# Patient Record
Sex: Male | Born: 1970 | Race: White | Hispanic: No | Marital: Married | Smoking: Never smoker
Health system: Southern US, Community
[De-identification: ages and names within clinical notes are randomized; demographics above are authoritative.]

## PROBLEM LIST (undated history)

## (undated) DIAGNOSIS — Z87442 Personal history of urinary calculi: Secondary | ICD-10-CM

## (undated) DIAGNOSIS — I10 Essential (primary) hypertension: Secondary | ICD-10-CM

## (undated) HISTORY — PX: HERNIA REPAIR: SHX51

## (undated) HISTORY — PX: APPENDECTOMY: SHX54

## (undated) HISTORY — PX: FOOT TENDON SURGERY: SHX958

## (undated) HISTORY — PX: FRACTURE SURGERY: SHX138

## (undated) SURGERY — Surgical Case
Anesthesia: *Unknown

---

## 2000-10-06 ENCOUNTER — Encounter: Payer: Self-pay | Admitting: *Deleted

## 2000-10-06 ENCOUNTER — Emergency Department (HOSPITAL_COMMUNITY): Admission: EM | Admit: 2000-10-06 | Discharge: 2000-10-06 | Payer: Self-pay | Admitting: *Deleted

## 2003-11-07 ENCOUNTER — Ambulatory Visit (HOSPITAL_COMMUNITY): Admission: RE | Admit: 2003-11-07 | Discharge: 2003-11-07 | Payer: Self-pay | Admitting: Family Medicine

## 2003-12-12 ENCOUNTER — Ambulatory Visit (HOSPITAL_COMMUNITY): Admission: RE | Admit: 2003-12-12 | Discharge: 2003-12-12 | Payer: Self-pay | Admitting: Urology

## 2006-12-28 DIAGNOSIS — I10 Essential (primary) hypertension: Secondary | ICD-10-CM

## 2006-12-28 HISTORY — DX: Essential (primary) hypertension: I10

## 2016-09-30 ENCOUNTER — Other Ambulatory Visit (HOSPITAL_COMMUNITY): Payer: Self-pay | Admitting: Internal Medicine

## 2016-09-30 DIAGNOSIS — R599 Enlarged lymph nodes, unspecified: Secondary | ICD-10-CM

## 2016-10-03 ENCOUNTER — Ambulatory Visit (HOSPITAL_COMMUNITY)
Admission: RE | Admit: 2016-10-03 | Discharge: 2016-10-03 | Disposition: A | Discharge status: Home / Self Care | Payer: 59 | Source: Ambulatory Visit | Attending: Internal Medicine | Admitting: Internal Medicine

## 2016-10-03 DIAGNOSIS — R599 Enlarged lymph nodes, unspecified: Secondary | ICD-10-CM | POA: Diagnosis not present

## 2018-01-23 ENCOUNTER — Observation Stay (HOSPITAL_COMMUNITY)
Admission: EM | Admit: 2018-01-23 | Discharge: 2018-01-25 | Disposition: A | Discharge status: Home / Self Care | Payer: Managed Care, Other (non HMO) | Attending: Surgery | Admitting: Surgery

## 2018-01-23 ENCOUNTER — Emergency Department (HOSPITAL_COMMUNITY): Payer: Managed Care, Other (non HMO)

## 2018-01-23 ENCOUNTER — Encounter (HOSPITAL_COMMUNITY): Payer: Self-pay | Admitting: Emergency Medicine

## 2018-01-23 ENCOUNTER — Other Ambulatory Visit: Payer: Self-pay

## 2018-01-23 DIAGNOSIS — Z419 Encounter for procedure for purposes other than remedying health state, unspecified: Secondary | ICD-10-CM

## 2018-01-23 DIAGNOSIS — Z8719 Personal history of other diseases of the digestive system: Secondary | ICD-10-CM | POA: Insufficient documentation

## 2018-01-23 DIAGNOSIS — Z7982 Long term (current) use of aspirin: Secondary | ICD-10-CM | POA: Diagnosis not present

## 2018-01-23 DIAGNOSIS — K8021 Calculus of gallbladder without cholecystitis with obstruction: Secondary | ICD-10-CM

## 2018-01-23 DIAGNOSIS — R0789 Other chest pain: Secondary | ICD-10-CM

## 2018-01-23 DIAGNOSIS — I10 Essential (primary) hypertension: Secondary | ICD-10-CM | POA: Insufficient documentation

## 2018-01-23 DIAGNOSIS — Z79899 Other long term (current) drug therapy: Secondary | ICD-10-CM | POA: Diagnosis not present

## 2018-01-23 DIAGNOSIS — K8000 Calculus of gallbladder with acute cholecystitis without obstruction: Secondary | ICD-10-CM | POA: Diagnosis present

## 2018-01-23 DIAGNOSIS — K8011 Calculus of gallbladder with chronic cholecystitis with obstruction: Secondary | ICD-10-CM | POA: Diagnosis not present

## 2018-01-23 HISTORY — DX: Essential (primary) hypertension: I10

## 2018-01-23 LAB — HEPATIC FUNCTION PANEL
ALK PHOS: 49 U/L (ref 38–126)
ALT: 23 U/L (ref 0–44)
AST: 57 U/L — AB (ref 15–41)
Albumin: 3.9 g/dL (ref 3.5–5.0)
BILIRUBIN DIRECT: 0.8 mg/dL — AB (ref 0.0–0.2)
BILIRUBIN TOTAL: 2.1 mg/dL — AB (ref 0.3–1.2)
Indirect Bilirubin: 1.3 mg/dL — ABNORMAL HIGH (ref 0.3–0.9)
Total Protein: 6.5 g/dL (ref 6.5–8.1)

## 2018-01-23 LAB — BASIC METABOLIC PANEL
Anion gap: 9 (ref 5–15)
BUN: 14 mg/dL (ref 6–20)
CALCIUM: 9.1 mg/dL (ref 8.9–10.3)
CO2: 27 mmol/L (ref 22–32)
CREATININE: 1.14 mg/dL (ref 0.61–1.24)
Chloride: 103 mmol/L (ref 98–111)
GFR calc non Af Amer: >60 mL/min (ref >60)
Glucose, Bld: 111 mg/dL — ABNORMAL HIGH (ref 70–99)
Potassium: 4.1 mmol/L (ref 3.5–5.1)
SODIUM: 139 mmol/L (ref 135–145)

## 2018-01-23 LAB — CBC
HCT: 49.4 % (ref 39.0–52.0)
Hemoglobin: 16.7 g/dL (ref 13.0–17.0)
MCH: 29.7 pg (ref 26.0–34.0)
MCHC: 33.8 g/dL (ref 30.0–36.0)
MCV: 87.7 fL (ref 78.0–100.0)
Platelets: 229 10*3/uL (ref 150–400)
RBC: 5.63 MIL/uL (ref 4.22–5.81)
RDW: 13.6 % (ref 11.5–15.5)
WBC: 7.7 10*3/uL (ref 4.0–10.5)

## 2018-01-23 LAB — I-STAT TROPONIN, ED
TROPONIN I, POC: 0.01 ng/mL (ref 0.00–0.08)
Troponin i, poc: 0 ng/mL (ref 0.00–0.08)

## 2018-01-23 LAB — LIPASE, BLOOD: Lipase: 33 U/L (ref 11–51)

## 2018-01-23 LAB — SURGICAL PCR SCREEN
MRSA, PCR: NEGATIVE
STAPHYLOCOCCUS AUREUS: NEGATIVE

## 2018-01-23 MED ORDER — LOSARTAN POTASSIUM-HCTZ 100-12.5 MG PO TABS: 1.0000 | ORAL_TABLET | Freq: Every day | ORAL | Status: DC

## 2018-01-23 MED ORDER — DIPHENHYDRAMINE HCL 50 MG/ML IJ SOLN: 25.0000 mg | Freq: Four times a day (QID) | INTRAMUSCULAR | Status: DC | PRN

## 2018-01-23 MED ORDER — MORPHINE SULFATE (PF) 2 MG/ML IV SOLN
2.0000 mg | INTRAVENOUS | Status: DC | PRN
  Administered 2018-01-23 – 2018-01-24 (×2): 2 mg via INTRAVENOUS
  Filled 2018-01-23 (×2): qty 1

## 2018-01-23 MED ORDER — ONDANSETRON HCL 4 MG/2ML IJ SOLN
4.0000 mg | Freq: Four times a day (QID) | INTRAMUSCULAR | Status: DC | PRN
  Administered 2018-01-23 – 2018-01-24 (×2): 4 mg via INTRAVENOUS
  Filled 2018-01-23 (×2): qty 2

## 2018-01-23 MED ORDER — ONDANSETRON 4 MG PO TBDP: 4.0000 mg | ORAL_TABLET | Freq: Four times a day (QID) | ORAL | Status: DC | PRN

## 2018-01-23 MED ORDER — HYDROCHLOROTHIAZIDE 12.5 MG PO CAPS
12.5000 mg | ORAL_CAPSULE | Freq: Every day | ORAL | Status: DC
  Administered 2018-01-23 – 2018-01-25 (×2): 12.5 mg via ORAL
  Filled 2018-01-23 (×2): qty 1

## 2018-01-23 MED ORDER — AMLODIPINE BESYLATE 5 MG PO TABS
5.0000 mg | ORAL_TABLET | Freq: Every day | ORAL | Status: DC
  Administered 2018-01-23 – 2018-01-25 (×2): 5 mg via ORAL
  Filled 2018-01-23 (×2): qty 1

## 2018-01-23 MED ORDER — ACETAMINOPHEN 325 MG PO TABS
650.0000 mg | ORAL_TABLET | Freq: Four times a day (QID) | ORAL | Status: DC | PRN
  Administered 2018-01-23 – 2018-01-25 (×3): 650 mg via ORAL
  Filled 2018-01-23 (×3): qty 2

## 2018-01-23 MED ORDER — SODIUM CHLORIDE 0.9 % IV SOLN
2.0000 g | INTRAVENOUS | Status: DC
  Administered 2018-01-23 – 2018-01-25 (×3): 2 g via INTRAVENOUS
  Filled 2018-01-23 (×3): qty 20

## 2018-01-23 MED ORDER — ACETAMINOPHEN 650 MG RE SUPP: 650.0000 mg | Freq: Four times a day (QID) | RECTAL | Status: DC | PRN

## 2018-01-23 MED ORDER — ENOXAPARIN SODIUM 40 MG/0.4ML ~~LOC~~ SOLN
40.0000 mg | SUBCUTANEOUS | Status: DC
  Administered 2018-01-24 – 2018-01-25 (×2): 40 mg via SUBCUTANEOUS
  Filled 2018-01-23 (×2): qty 0.4

## 2018-01-23 MED ORDER — OXYCODONE HCL 5 MG PO TABS
5.0000 mg | ORAL_TABLET | ORAL | Status: DC | PRN
  Administered 2018-01-23 – 2018-01-25 (×5): 5 mg via ORAL
  Filled 2018-01-23 (×5): qty 1

## 2018-01-23 MED ORDER — DIPHENHYDRAMINE HCL 25 MG PO CAPS: 25.0000 mg | ORAL_CAPSULE | Freq: Four times a day (QID) | ORAL | Status: DC | PRN

## 2018-01-23 MED ORDER — LOSARTAN POTASSIUM 50 MG PO TABS
100.0000 mg | ORAL_TABLET | Freq: Every day | ORAL | Status: DC
  Administered 2018-01-23 – 2018-01-25 (×2): 100 mg via ORAL
  Filled 2018-01-23 (×2): qty 2

## 2018-01-23 MED ORDER — SODIUM CHLORIDE 0.9 % IV SOLN
INTRAVENOUS | Status: DC
  Administered 2018-01-23 – 2018-01-25 (×4): via INTRAVENOUS

## 2018-01-23 MED ORDER — ASPIRIN 81 MG PO CHEW
324.0000 mg | CHEWABLE_TABLET | Freq: Once | ORAL | Status: AC
Start: 2018-01-23 — End: 2018-01-23
  Administered 2018-01-23: 324 mg via ORAL
  Filled 2018-01-23: qty 4

## 2018-01-23 MED ORDER — FAMOTIDINE 20 MG PO TABS
40.0000 mg | ORAL_TABLET | Freq: Once | ORAL | Status: AC
  Administered 2018-01-23: 40 mg via ORAL
  Filled 2018-01-23: qty 2

## 2018-01-23 MED ORDER — INFLUENZA VAC SPLIT QUAD 0.5 ML IM SUSY: 0.5000 mL | PREFILLED_SYRINGE | INTRAMUSCULAR | Status: DC

## 2018-01-23 NOTE — ED Provider Notes (Signed)
[facility] EMERGENCY DEPARTMENT Provider Note   CSN: [id number] Arrival date & time: 01/23/18  0734     History   Chief Complaint Chief Complaint  Patient presents with  . Chest Pain    HPI Jacob Powell is a 47 y.o. male.  HPI Patient awakened about 5 AM with chest pain.  He is not sure if he was already awake or he was awakened by the pain.  It was aching in quality.  He indicates his epigastrium and up the center of the chest and slightly out to the sides in the sub-diaphragmatic region more to the right than the left.  He denies shortness of breath.  He denies nausea or vomiting.  He reports that he tried some Tums and milk without any real relief.  Pain has resolved now.  He has a distant history of gastric ulcer.  He does not routinely take any antacid medications.  Patient still has his gallbladder.  He did eat ham and cheese sandwich at [employer] last night.  He has not been experiencing exertional chest pain or shortness of breath.  He has very distant history of a cardiac stress test.  Family history negative for early coronary artery disease or sudden death.  Patient is non-smoker.  Patient takes antihypertensive medication regularly.  He did not take it this morning before coming to the emergency department. Past Medical History:  Diagnosis Date  . Hypertension     Patient Active Problem List   Diagnosis Date Noted  . Acute cholecystitis due to biliary calculus 01/23/2018    Past Surgical History:  Procedure Laterality Date  . APPENDECTOMY    . HERNIA REPAIR          Home Medications    Prior to Admission medications   Medication Sig Start Date End Date Taking? Authorizing Provider  acetaminophen (TYLENOL) 500 MG tablet Take 1,000 mg by mouth every 6 (six) hours as needed for headache.   Yes [provider]  amLODipine (NORVASC) 5 MG tablet Take 5 mg by mouth daily. 01/11/18  Yes [provider]    losartan-hydrochlorothiazide (HYZAAR) 100-12.5 MG tablet Take 1 tablet by mouth daily. 01/09/18  Yes [provider]  Multiple Vitamins-Minerals (MENS MULTIVITAMIN PLUS PO) Take 1 tablet by mouth daily.   Yes [provider]    Family History No family history on file.  Social History Social History   Tobacco Use  . Smoking status: Never Smoker  Substance Use Topics  . Alcohol use: Yes    Comment: occasional  . Drug use: Never     Allergies   Patient has no known allergies.   Review of Systems Review of Systems 10 Systems reviewed and are negative for acute change except as noted in the HPI.   Physical Exam Updated Vital Signs BP (!) 137/99 (BP Location: Right Arm)   Pulse (!) 55   Temp 98 F (36.7 C) (Oral)   Resp 18   SpO2 97%   Physical Exam  Constitutional: He is oriented to person, place, and time.  Patient alert and nontoxic.  Clinically well in appearance.  No respiratory distress.  Moderate obesity.  HENT:  Head: Normocephalic and atraumatic.  Eyes: EOM are normal.  Cardiovascular: Normal rate, regular rhythm, normal heart sounds and intact distal pulses.  Pulmonary/Chest: Effort normal and breath sounds normal.  Abdominal:  Mild epigastric discomfort to palpation.  Right upper quadrant mildly tender to palpation.  Lower abdomen nontender.  Musculoskeletal: Normal range of motion. He exhibits no edema or tenderness.  Neurological: He is alert and oriented to person, place, and time. He exhibits normal muscle tone. Coordination normal.  Skin: Skin is warm and dry.  Psychiatric: He has a normal mood and affect.     ED Treatments / Results  Labs (all labs ordered are listed, but only abnormal results are displayed) Labs Reviewed  BASIC METABOLIC PANEL - Abnormal; Notable for the following components:      Result Value   Glucose, Bld 111 (*)    All other components within normal limits  HEPATIC FUNCTION PANEL - Abnormal; Notable for  the following components:   AST 57 (*)    Total Bilirubin 2.1 (*)    Bilirubin, Direct 0.8 (*)    Indirect Bilirubin 1.3 (*)    All other components within normal limits  CBC  LIPASE, BLOOD  I-STAT TROPONIN, ED  I-STAT TROPONIN, ED    EKG EKG Interpretation  Date/Time:  Saturday January 23 2018 07:37:30 EDT Ventricular Rate:  59 PR Interval:  158 QRS Duration: 88 QT Interval:  418 QTC Calculation: 413 R Axis:   20 Text Interpretation:  Sinus bradycardia Minimal voltage criteria for LVH, may be normal variant Borderline ECG agree. no STEMI. Confirmed by Arby Barrette [id number]) on 01/23/2018 8:11:50 AM Also confirmed by Arby Barrette [phone]), editor Barbette Hair [phone])  on 01/23/2018 8:48:56 AM   Radiology Dg Chest 2 View  Result Date: 01/23/2018 CLINICAL DATA:  Chest pain.  Hypertension. EXAM: CHEST - 2 VIEW COMPARISON:  None. FINDINGS: There is no edema or consolidation. The heart size and pulmonary vascularity are normal. No adenopathy. No bone lesions. No pneumothorax. IMPRESSION: No edema or consolidation. Electronically Signed   By: Bretta Bang III M.D.   On: 01/23/2018 08:30   US Abdomen Limited  Result Date: 01/23/2018 CLINICAL DATA:  Epigastric pain. EXAM: ULTRASOUND ABDOMEN LIMITED RIGHT UPPER QUADRANT COMPARISON:  None. FINDINGS: Gallbladder: Cholelithiasis without pericholecystic fluid or gallbladder wall thickening. Largest gallstone measures 2.1 cm in the gallbladder neck which is non mobile. Negative sonographic Murphy sign. Common bile duct: Diameter: 4.3 mm Liver: No focal lesion identified. Increased hepatic parenchymal echogenicity. Portal vein is patent on color Doppler imaging with normal direction of blood flow towards the liver. IMPRESSION: 1. Cholelithiasis without sonographic evidence of acute cholecystitis. Large 2.1 cm gallstone impacted in the gallbladder neck. 2. Increased hepatic echogenicity as can be seen with hepatic steatosis. Electronically  Signed   By: Elige Ko   On: 01/23/2018 11:11    Procedures Procedures (including critical care time)  Medications Ordered in ED Medications  famotidine (PEPCID) tablet 40 mg (40 mg Oral Given 01/23/18 0946)  aspirin chewable tablet 324 mg (324 mg Oral Given 01/23/18 0947)     Initial Impression / Assessment and Plan / ED Course  I have reviewed the triage vital signs and the nursing notes.  Pertinent labs & imaging results that were available during my care of the patient were reviewed by me and considered in my medical decision making (see chart for details).  Clinical Course as of Jan 23 1317  Sat Jan 23, 2018  1139 Consult order placed for general surgery.   [MP]  1143 Consult: Dr. Margaree Mackintosh  (general surgery) has returned call and will evaluate the patient.   [MP]    Clinical Course User Index [MP] Arby Barrette, MD   Patient presented with chief complaint of chest pain.  He did have a  significant amount of epigastric discomfort.  Diagnostic work-up for cardiac etiology is negative.  2 sets of cardiac enzymes negative.  Ultrasound identifies obstructing gallstone.  No cholecystitis.  Dr. Corliss Skains to admit for definitive management.   Final Clinical Impressions(s) / ED Diagnoses   Final diagnoses:  Calculus of gallbladder with biliary obstruction but without cholecystitis  Other chest pain    ED Discharge Orders    None       Arby Barrette, MD 01/23/18 1318

## 2018-01-23 NOTE — Plan of Care (Signed)
  Problem: Activity: Goal: Risk for activity intolerance will decrease Outcome: Progressing   Problem: Nutrition: Goal: Adequate nutrition will be maintained Outcome: Progressing   

## 2018-01-23 NOTE — ED Triage Notes (Signed)
Patient reports central tight chest pain that awoke him from sleep this morning, denies sob. Pt reports some pain up into his throat as well as some nausea, denies pain radiation to arms or back. Pt a/ox4, resp e/u, nad.

## 2018-01-23 NOTE — H&P (Signed)
Jacob Powell is an 47 y.o. male.   Chief Complaint: Epigastric abdominal pain, nausea HPI: This is a 47 year old male with hypertension who presents with acute onset of epigastric/ upper abdominal pain at 5 AM.  This awoke him from sleep.  He had a ham and cheese sandwich and ice cream last night.  No previous similar episodes.  The pain radiated through to his back.  He had some nausea but no vomiting.  No diarrhea.  He did have some abdominal bloating.  The pain has improved but some tenderness still remains.  Past Medical History:  Diagnosis Date  . Hypertension     Past Surgical History:  Procedure Laterality Date  . APPENDECTOMY    . HERNIA REPAIR    Open RIH repair many years ago Laparoscopic RIH hernia repair for recurrence - Dr. Ladona Horns - [place]  No family history on file. Social History:  reports that he has never smoked. He does not have any smokeless tobacco history on file. He reports that he drinks alcohol. He reports that he does not use drugs.  Allergies: No Known Allergies  Prior to Admission medications   Medication Sig Start Date End Date Taking? Authorizing Provider  acetaminophen (TYLENOL) 500 MG tablet Take 1,000 mg by mouth every 6 (six) hours as needed for headache.   Yes [provider]  amLODipine (NORVASC) 5 MG tablet Take 5 mg by mouth daily. 01/11/18  Yes [provider]  losartan-hydrochlorothiazide (HYZAAR) 100-12.5 MG tablet Take 1 tablet by mouth daily. 01/09/18  Yes [provider]  Multiple Vitamins-Minerals (MENS MULTIVITAMIN PLUS PO) Take 1 tablet by mouth daily.   Yes [provider]     Results for orders placed or performed during the hospital encounter of 01/23/18 (from the past 48 hour(s))  Basic metabolic panel     Status: Abnormal   Collection Time: 01/23/18  7:49 AM  Result Value Ref Range   Sodium 139 135 - 145 mmol/L   Potassium 4.1 3.5 - 5.1 mmol/L    Comment: SLIGHT HEMOLYSIS   Chloride 103 98  - 111 mmol/L   CO2 27 22 - 32 mmol/L   Glucose, Bld 111 (H) 70 - 99 mg/dL   BUN 14 6 - 20 mg/dL   Creatinine, Ser 1.14 0.61 - 1.24 mg/dL   Calcium 9.1 8.9 - 10.3 mg/dL   GFR calc non Af Amer >60 >60 mL/min   GFR calc Af Amer >60 >60 mL/min    Comment: (NOTE) The eGFR has been calculated using the CKD EPI equation. This calculation has not been validated in all clinical situations. eGFR's persistently <60 mL/min signify possible Chronic Kidney Disease.    Anion gap 9 5 - 15    Comment: Performed at [facility] [address], [place] [address]  CBC     Status: None   Collection Time: 01/23/18  7:49 AM  Result Value Ref Range   WBC 7.7 4.0 - 10.5 K/uL   RBC 5.63 4.22 - 5.81 MIL/uL   Hemoglobin 16.7 13.0 - 17.0 g/dL   HCT 49.4 39.0 - 52.0 %   MCV 87.7 78.0 - 100.0 fL   MCH 29.7 26.0 - 34.0 pg   MCHC 33.8 30.0 - 36.0 g/dL   RDW 13.6 11.5 - 15.5 %   Platelets 229 150 - 400 K/uL    Comment: Performed at [facility] [address], [place], [place] [address]  I-stat troponin, ED  Status: None   Collection Time: 01/23/18  8:08 AM  Result Value Ref Range   Troponin i, poc 0.01 0.00 - 0.08 ng/mL   Comment 3            Comment: Due to the release kinetics of cTnI, a negative result within the first hours of the onset of symptoms does not rule out myocardial infarction with certainty. If myocardial infarction is still suspected, repeat the test at appropriate intervals.   Hepatic function panel     Status: Abnormal   Collection Time: 01/23/18  9:11 AM  Result Value Ref Range   Total Protein 6.5 6.5 - 8.1 g/dL   Albumin 3.9 3.5 - 5.0 g/dL   AST 57 (H) 15 - 41 U/L   ALT 23 0 - 44 U/L   Alkaline Phosphatase 49 38 - 126 U/L   Total Bilirubin 2.1 (H) 0.3 - 1.2 mg/dL   Bilirubin, Direct 0.8 (H) 0.0 - 0.2 mg/dL   Indirect Bilirubin 1.3 (H) 0.3 - 0.9 mg/dL    Comment: Performed at [facility] [address] [place], [place] [address]  Lipase,  blood     Status: None   Collection Time: 01/23/18  9:11 AM  Result Value Ref Range   Lipase 33 11 - 51 U/L    Comment: Performed at [facility] [address], [place],  [address]  I-stat troponin, ED     Status: None   Collection Time: 01/23/18 11:19 AM  Result Value Ref Range   Troponin i, poc 0.00 0.00 - 0.08 ng/mL   Comment 3            Comment: Due to the release kinetics of cTnI, a negative result within the first hours of the onset of symptoms does not rule out myocardial infarction with certainty. If myocardial infarction is still suspected, repeat the test at appropriate intervals.    Dg Chest 2 View  Result Date: 01/23/2018 CLINICAL DATA:  Chest pain.  Hypertension. EXAM: CHEST - 2 VIEW COMPARISON:  None. FINDINGS: There is no edema or consolidation. The heart size and pulmonary vascularity are normal. No adenopathy. No bone lesions. No pneumothorax. IMPRESSION: No edema or consolidation. Electronically Signed   By: Lowella Grip III M.D.   On: 01/23/2018 08:30   US Abdomen Limited  Result Date: 01/23/2018 CLINICAL DATA:  Epigastric pain. EXAM: ULTRASOUND ABDOMEN LIMITED RIGHT UPPER QUADRANT COMPARISON:  None. FINDINGS: Gallbladder: Cholelithiasis without pericholecystic fluid or gallbladder wall thickening. Largest gallstone measures 2.1 cm in the gallbladder neck which is non mobile. Negative sonographic Murphy sign. Common bile duct: Diameter: 4.3 mm Liver: No focal lesion identified. Increased hepatic parenchymal echogenicity. Portal vein is patent on color Doppler imaging with normal direction of blood flow towards the liver. IMPRESSION: 1. Cholelithiasis without sonographic evidence of acute cholecystitis. Large 2.1 cm gallstone impacted in the gallbladder neck. 2. Increased hepatic echogenicity as can be seen with hepatic steatosis. Electronically Signed   By: Kathreen Devoid   On: 01/23/2018 11:11    Review of Systems  Constitutional: Negative for weight  loss.  HENT: Negative for ear discharge, ear pain, hearing loss and tinnitus.   Eyes: Negative for blurred vision, double vision, photophobia and pain.  Respiratory: Negative for cough, sputum production and shortness of breath.   Cardiovascular: Negative for chest pain.  Gastrointestinal: Positive for abdominal pain and nausea. Negative for vomiting.  Genitourinary: Negative for dysuria, flank pain, frequency and urgency.  Musculoskeletal:  Negative for back pain, falls, joint pain, myalgias and neck pain.  Neurological: Negative for dizziness, tingling, sensory change, focal weakness, loss of consciousness and headaches.  Endo/Heme/Allergies: Does not bruise/bleed easily.  Psychiatric/Behavioral: Negative for depression, memory loss and substance abuse. The patient is not nervous/anxious.     Blood pressure (!) 124/92, pulse (!) 57, temperature 98 F (36.7 C), temperature source Oral, resp. rate 19, SpO2 98 %. Physical Exam  WDWN in NAD Eyes:  Pupils equal, round; sclera anicteric HENT:  Oral mucosa moist; good dentition  Neck:  No masses palpated, no thyromegaly Lungs:  CTA bilaterally; normal respiratory effort CV:  Regular rate and rhythm; no murmurs; extremities well-perfused with no edema Abd:  +bowel sounds, soft, tender in epigastrium/ RUQ; no palpable masses Healed RLQ incision/ R groin incision/ periumbilical laparoscopic incision Skin:  Warm, dry; no sign of jaundice Psychiatric - alert and oriented x 4; calm mood and affect   Assessment/Plan Acute calculus cholecystitis - slightly elevated bilirubin but normal CBD on [place]  Admit for IV antibiotics Recheck labs in AM Laparoscopic cholecystectomy with possible cholangiogram by Dr. Brantley Stage tomorrow.  Imogene Burn. Georgette Dover, MD, [facility] [phone]  01/23/2018 12:59 PM

## 2018-01-24 ENCOUNTER — Encounter (HOSPITAL_COMMUNITY)
Admission: EM | Disposition: A | Discharge status: Home / Self Care | Payer: Self-pay | Source: Home / Self Care | Attending: Emergency Medicine

## 2018-01-24 ENCOUNTER — Observation Stay (HOSPITAL_COMMUNITY): Payer: Managed Care, Other (non HMO) | Admitting: Certified Registered Nurse Anesthetist

## 2018-01-24 ENCOUNTER — Other Ambulatory Visit: Payer: Self-pay

## 2018-01-24 ENCOUNTER — Observation Stay (HOSPITAL_COMMUNITY): Payer: Managed Care, Other (non HMO)

## 2018-01-24 HISTORY — PX: CHOLECYSTECTOMY: SHX55

## 2018-01-24 LAB — HIV ANTIBODY (ROUTINE TESTING W REFLEX): HIV Screen 4th Generation wRfx: NONREACTIVE

## 2018-01-24 SURGERY — LAPAROSCOPIC CHOLECYSTECTOMY WITH INTRAOPERATIVE CHOLANGIOGRAM
Anesthesia: General | Site: Abdomen

## 2018-01-24 MED ORDER — PROMETHAZINE HCL 25 MG/ML IJ SOLN: 6.2500 mg | INTRAMUSCULAR | Status: DC | PRN

## 2018-01-24 MED ORDER — EPHEDRINE SULFATE 50 MG/ML IJ SOLN
INTRAMUSCULAR | Status: DC | PRN
  Administered 2018-01-24: 15 mg via INTRAVENOUS
  Administered 2018-01-24: 10 mg via INTRAVENOUS

## 2018-01-24 MED ORDER — LIDOCAINE HCL (CARDIAC) PF 100 MG/5ML IV SOSY
PREFILLED_SYRINGE | INTRAVENOUS | Status: DC | PRN
  Administered 2018-01-24: 100 mg via INTRATRACHEAL

## 2018-01-24 MED ORDER — OXYCODONE-ACETAMINOPHEN 5-325 MG PO TABS
1.0000 | ORAL_TABLET | ORAL | Status: DC | PRN
  Administered 2018-01-24 – 2018-01-25 (×4): 1 via ORAL
  Filled 2018-01-24 (×3): qty 1

## 2018-01-24 MED ORDER — DEXAMETHASONE SODIUM PHOSPHATE 4 MG/ML IJ SOLN
INTRAMUSCULAR | Status: DC | PRN
  Administered 2018-01-24: 5 mg via INTRAVENOUS

## 2018-01-24 MED ORDER — MIDAZOLAM HCL 5 MG/5ML IJ SOLN
INTRAMUSCULAR | Status: DC | PRN
  Administered 2018-01-24: 2 mg via INTRAVENOUS

## 2018-01-24 MED ORDER — HYDROMORPHONE HCL 1 MG/ML IJ SOLN
0.2500 mg | INTRAMUSCULAR | Status: DC | PRN
  Administered 2018-01-24 (×2): 0.5 mg via INTRAVENOUS

## 2018-01-24 MED ORDER — OXYCODONE-ACETAMINOPHEN 5-325 MG PO TABS
ORAL_TABLET | ORAL | Status: AC
  Filled 2018-01-24: qty 1

## 2018-01-24 MED ORDER — FENTANYL CITRATE (PF) 250 MCG/5ML IJ SOLN
INTRAMUSCULAR | Status: AC
  Filled 2018-01-24: qty 5

## 2018-01-24 MED ORDER — FENTANYL CITRATE (PF) 100 MCG/2ML IJ SOLN
INTRAMUSCULAR | Status: DC | PRN
  Administered 2018-01-24 (×5): 50 ug via INTRAVENOUS

## 2018-01-24 MED ORDER — IOPAMIDOL (ISOVUE-300) INJECTION 61%
INTRAVENOUS | Status: AC
  Filled 2018-01-24: qty 50

## 2018-01-24 MED ORDER — KETOROLAC TROMETHAMINE 30 MG/ML IJ SOLN
INTRAMUSCULAR | Status: DC | PRN
  Administered 2018-01-24: 30 mg via INTRAVENOUS

## 2018-01-24 MED ORDER — SCOPOLAMINE 1 MG/3DAYS TD PT72
MEDICATED_PATCH | TRANSDERMAL | Status: DC | PRN
  Administered 2018-01-24: 1 via TRANSDERMAL

## 2018-01-24 MED ORDER — HYDROMORPHONE HCL 1 MG/ML IJ SOLN
INTRAMUSCULAR | Status: AC
  Administered 2018-01-24: 0.5 mg via INTRAVENOUS
  Filled 2018-01-24: qty 1

## 2018-01-24 MED ORDER — PROPOFOL 10 MG/ML IV BOLUS
INTRAVENOUS | Status: AC
  Filled 2018-01-24: qty 20

## 2018-01-24 MED ORDER — ROCURONIUM BROMIDE 50 MG/5ML IV SOSY
PREFILLED_SYRINGE | INTRAVENOUS | Status: DC | PRN
  Administered 2018-01-24: 10 mg via INTRAVENOUS
  Administered 2018-01-24: 40 mg via INTRAVENOUS

## 2018-01-24 MED ORDER — MIDAZOLAM HCL 2 MG/2ML IJ SOLN
INTRAMUSCULAR | Status: AC
  Filled 2018-01-24: qty 2

## 2018-01-24 MED ORDER — 0.9 % SODIUM CHLORIDE (POUR BTL) OPTIME
TOPICAL | Status: DC | PRN
  Administered 2018-01-24 (×2): 1000 mL

## 2018-01-24 MED ORDER — BUPIVACAINE-EPINEPHRINE 0.25% -1:200000 IJ SOLN
INTRAMUSCULAR | Status: DC | PRN
  Administered 2018-01-24: 5 mL

## 2018-01-24 MED ORDER — SUGAMMADEX SODIUM 200 MG/2ML IV SOLN
INTRAVENOUS | Status: DC | PRN
  Administered 2018-01-24: 250 mg via INTRAVENOUS

## 2018-01-24 MED ORDER — SODIUM CHLORIDE 0.9 % IR SOLN
Status: DC | PRN
  Administered 2018-01-24 (×2): 1000 mL

## 2018-01-24 MED ORDER — SODIUM CHLORIDE 0.9 % IV SOLN
INTRAVENOUS | Status: DC | PRN
  Administered 2018-01-24: 20 ug/min via INTRAVENOUS

## 2018-01-24 MED ORDER — PROPOFOL 10 MG/ML IV BOLUS
INTRAVENOUS | Status: DC | PRN
  Administered 2018-01-24: 200 mg via INTRAVENOUS

## 2018-01-24 MED ORDER — LACTATED RINGERS IV SOLN
INTRAVENOUS | Status: DC | PRN
  Administered 2018-01-24: 08:00:00 via INTRAVENOUS

## 2018-01-24 MED ORDER — SUCCINYLCHOLINE CHLORIDE 20 MG/ML IJ SOLN
INTRAMUSCULAR | Status: DC | PRN
  Administered 2018-01-24: 120 mg via INTRAVENOUS

## 2018-01-24 MED ORDER — PHENYLEPHRINE HCL 10 MG/ML IJ SOLN
INTRAMUSCULAR | Status: DC | PRN
  Administered 2018-01-24: 40 ug via INTRAVENOUS

## 2018-01-24 MED ORDER — BUPIVACAINE-EPINEPHRINE (PF) 0.25% -1:200000 IJ SOLN
INTRAMUSCULAR | Status: AC
  Filled 2018-01-24: qty 30

## 2018-01-24 SURGICAL SUPPLY — 43 items
ADH SKN CLS APL DERMABOND .7 (GAUZE/BANDAGES/DRESSINGS) ×1
APPLIER CLIP ROT 10 11.4 M/L (STAPLE) ×2
APR CLP MED LRG 11.4X10 (STAPLE) ×1
BAG SPEC RTRVL 10 TROC 200 (ENDOMECHANICALS) ×1
BLADE CLIPPER SURG (BLADE) ×1 IMPLANT
CANISTER SUCT 3000ML PPV (MISCELLANEOUS) ×2 IMPLANT
CHLORAPREP W/TINT 26ML (MISCELLANEOUS) ×2 IMPLANT
CLIP APPLIE ROT 10 11.4 M/L (STAPLE) ×1 IMPLANT
COVER MAYO STAND STRL (DRAPES) ×2 IMPLANT
COVER SURGICAL LIGHT HANDLE (MISCELLANEOUS) ×2 IMPLANT
DERMABOND ADVANCED (GAUZE/BANDAGES/DRESSINGS) ×1
DERMABOND ADVANCED .7 DNX12 (GAUZE/BANDAGES/DRESSINGS) ×1 IMPLANT
DRAPE C-ARM 42X72 X-RAY (DRAPES) ×3 IMPLANT
DRAPE WARM FLUID 44X44 (DRAPE) ×1 IMPLANT
ELECT REM PT RETURN 9FT ADLT (ELECTROSURGICAL) ×2
ELECTRODE REM PT RTRN 9FT ADLT (ELECTROSURGICAL) ×1 IMPLANT
GLOVE BIO SURGEON STRL SZ8 (GLOVE) ×2 IMPLANT
GLOVE BIOGEL PI IND STRL 8 (GLOVE) ×1 IMPLANT
GLOVE BIOGEL PI INDICATOR 8 (GLOVE) ×1
GOWN STRL REUS W/ TWL LRG LVL3 (GOWN DISPOSABLE) ×2 IMPLANT
GOWN STRL REUS W/ TWL XL LVL3 (GOWN DISPOSABLE) ×1 IMPLANT
GOWN STRL REUS W/TWL LRG LVL3 (GOWN DISPOSABLE) ×6
GOWN STRL REUS W/TWL XL LVL3 (GOWN DISPOSABLE) ×2
KIT BASIN OR (CUSTOM PROCEDURE TRAY) ×2 IMPLANT
KIT TURNOVER KIT B (KITS) ×2 IMPLANT
NS IRRIG 1000ML POUR BTL (IV SOLUTION) ×3 IMPLANT
PAD ARMBOARD 7.5X6 YLW CONV (MISCELLANEOUS) ×2 IMPLANT
POUCH RETRIEVAL ECOSAC 10 (ENDOMECHANICALS) ×1 IMPLANT
POUCH RETRIEVAL ECOSAC 10MM (ENDOMECHANICALS) ×1
SCISSORS LAP 5X35 DISP (ENDOMECHANICALS) ×2 IMPLANT
SET CHOLANGIOGRAPH 5 50 .035 (SET/KITS/TRAYS/PACK) ×2 IMPLANT
SET IRRIG TUBING LAPAROSCOPIC (IRRIGATION / IRRIGATOR) ×2 IMPLANT
SLEEVE ENDOPATH XCEL 5M (ENDOMECHANICALS) ×2 IMPLANT
SPECIMEN JAR SMALL (MISCELLANEOUS) ×2 IMPLANT
SUT MNCRL AB 4-0 PS2 18 (SUTURE) ×2 IMPLANT
TOWEL OR 17X24 6PK STRL BLUE (TOWEL DISPOSABLE) ×2 IMPLANT
TOWEL OR 17X26 10 PK STRL BLUE (TOWEL DISPOSABLE) ×2 IMPLANT
TRAY LAPAROSCOPIC MC (CUSTOM PROCEDURE TRAY) ×2 IMPLANT
TROCAR XCEL BLUNT TIP 100MML (ENDOMECHANICALS) ×2 IMPLANT
TROCAR XCEL NON-BLD 11X100MML (ENDOMECHANICALS) ×2 IMPLANT
TROCAR XCEL NON-BLD 5MMX100MML (ENDOMECHANICALS) ×2 IMPLANT
TUBING INSUFFLATION (TUBING) ×2 IMPLANT
WATER STERILE IRR 1000ML POUR (IV SOLUTION) ×2 IMPLANT

## 2018-01-24 NOTE — Anesthesia Preprocedure Evaluation (Addendum)
Anesthesia Evaluation  Patient identified by MRN, date of birth, ID band Patient awake    Reviewed: Allergy & Precautions, NPO status , Patient's Chart, lab work & pertinent test results  History of Anesthesia Complications Negative for: history of anesthetic complications  Airway Mallampati: II  TM Distance: >3 FB Neck ROM: Full    Dental no notable dental hx. (+) Dental Advisory Given   Pulmonary neg pulmonary ROS,    Pulmonary exam normal        Cardiovascular hypertension, Pt. on medications Normal cardiovascular exam     Neuro/Psych negative neurological ROS     GI/Hepatic Neg liver ROS,   Endo/Other  Morbid obesity  Renal/GU negative Renal ROS     Musculoskeletal negative musculoskeletal ROS (+)   Abdominal   Peds  Hematology negative hematology ROS (+)   Anesthesia Other Findings Day of surgery medications reviewed with the patient.  Reproductive/Obstetrics                            Anesthesia Physical Anesthesia Plan  ASA: II  Anesthesia Plan: General   Post-op Pain Management:    Induction: Intravenous  PONV Risk Score and Plan: 3 and Ondansetron, Dexamethasone and Scopolamine patch - Pre-op  Airway Management Planned: Oral ETT  Additional Equipment:   Intra-op Plan:   Post-operative Plan: Extubation in OR  Informed Consent: I have reviewed the patients History and Physical, chart, labs and discussed the procedure including the risks, benefits and alternatives for the proposed anesthesia with the patient or authorized representative who has indicated his/her understanding and acceptance.   Dental advisory given  Plan Discussed with: CRNA, Anesthesiologist and Surgeon  Anesthesia Plan Comments:        Anesthesia Quick Evaluation

## 2018-01-24 NOTE — Anesthesia Procedure Notes (Signed)
Procedure Name: Intubation Date/Time: 01/24/2018 8:33 AM Performed by: Tillman Abide, CRNA Pre-anesthesia Checklist: Patient identified, Emergency Drugs available, Suction available and Patient being monitored Patient Re-evaluated:Patient Re-evaluated prior to induction Oxygen Delivery Method: Circle System Utilized Preoxygenation: Pre-oxygenation with 100% oxygen Induction Type: IV induction Ventilation: Mask ventilation without difficulty Laryngoscope Size: Miller and 4 Grade View: Grade I Tube type: Oral Number of attempts: 1 Airway Equipment and Method: Stylet Placement Confirmation: ETT inserted through vocal cords under direct vision,  positive ETCO2 and breath sounds checked- equal and bilateral Secured at: 23 cm Tube secured with: Tape Dental Injury: Teeth and Oropharynx as per pre-operative assessment

## 2018-01-24 NOTE — Op Note (Signed)
Laparoscopic Cholecystectomy with IOC Procedure Note  Indications: This patient presents with symptomatic gallbladder disease and will undergo laparoscopic cholecystectomy. The procedure has been discussed with the patient. Operative and non operative treatments have been discussed. Risks of surgery include bleeding, infection,  Common bile duct injury,  Injury to the stomach,liver, colon,small intestine, abdominal wall,  Diaphragm,  Major blood vessels,  And the need for an open procedure.  Other risks include worsening of medical problems, death,  DVT and pulmonary embolism, and cardiovascular events.   Medical options have also been discussed. The patient has been informed of long term expectations of surgery and non surgical options,  The patient agrees to proceed.    Pre-operative Diagnosis: Calculus of gallbladder with acute cholecystitis, without mention of obstruction  Post-operative Diagnosis: Same  Surgeon: Clovis Pu Asmi Fugere   Assistants: NONE   Anesthesia: General endotracheal anesthesia and Local anesthesia 0.25.% bupivacaine, with epinephrine  ASA Class: 2  Procedure Details  The patient was seen again in the Holding Room. The risks, benefits, complications, treatment options, and expected outcomes were discussed with the patient. The possibilities of reaction to medication, pulmonary aspiration, perforation of viscus, bleeding, recurrent infection, finding a normal gallbladder, the need for additional procedures, failure to diagnose a condition, the possible need to convert to an open procedure, and creating a complication requiring transfusion or operation were discussed with the patient. The patient and/or family concurred with the proposed plan, giving informed consent. The site of surgery properly noted/marked. The patient was taken to Operating Room, identified as TRAVARUS TRUDO and the procedure verified as Laparoscopic Cholecystectomy with Intraoperative Cholangiograms. A Time  Out was held and the above information confirmed.  Prior to the induction of general anesthesia, antibiotic prophylaxis was administered. General endotracheal anesthesia was then administered and tolerated well. After the induction, the abdomen was prepped in the usual sterile fashion. The patient was positioned in the supine position with the left arm comfortably tucked, along with some reverse Trendelenburg.  Local anesthetic agent was injected into the skin near the umbilicus and an incision made. The midline fascia was incised and the Hasson technique was used to introduce a 12 mm port under direct vision. It was secured with a figure of eight Vicryl suture placed in the usual fashion. Pneumoperitoneum was then created with CO2 and tolerated well without any adverse changes in the patient's vital signs. Additional trocars were introduced under direct vision with an 11 mm trocar in the epigastrium and 2 5 mm trocars in the right upper quadrant. All skin incisions were infiltrated with a local anesthetic agent before making the incision and placing the trocars.   The gallbladder was identified, the fundus grasped and retracted cephalad. Adhesions were lysed bluntly and with the electrocautery where indicated, taking care not to injure any adjacent organs or viscus. The infundibulum was grasped and retracted laterally, exposing the peritoneum overlying the triangle of Calot. This was then divided and exposed in a blunt fashion. The cystic duct was clearly identified and bluntly dissected circumferentially. The junctions of the gallbladder, cystic duct and common bile duct were clearly identified prior to the division of any linear structure.   An incision was made in the cystic duct and the cholangiogram catheter introduced. The catheter was secured using an endoclip. The study showed no stones and good visualization of the distal and proximal biliary tree. The catheter was then removed.   The cystic duct  was then  ligated with surgical clips  on the  patient side and  clipped on the gallbladder side and divided. The cystic artery was identified, dissected free, ligated with clips and divided as well. Posterior cystic artery clipped and divided.  The gallbladder was dissected from the liver bed in retrograde fashion with the electrocautery. The gallbladder was removed. The liver bed was irrigated and inspected. Hemostasis was achieved with the electrocautery. Copious irrigation was utilized and was repeatedly aspirated until clear all particulate matter. Hemostasis was achieved with no signs  Of bleeding or bile leakage.  Pneumoperitoneum was completely reduced after viewing removal of the trocars under direct vision. The wound was thoroughly irrigated and the fascia was then closed with a figure of eight suture; the skin was then closed with 4 0 monocryl  and a sterile dressing was applied.  Instrument, sponge, and needle counts were correct at closure and at the conclusion of the case.   Findings: Cholecystitis with Cholelithiasis  Estimated Blood Loss: less than 50 mL         Drains: none          Total IV Fluids: per op report          Specimens: Gallbladder           Complications: None; patient tolerated the procedure well.         Disposition: PACU - hemodynamically stable.         Condition: stable

## 2018-01-24 NOTE — Transfer of Care (Signed)
Immediate Anesthesia Transfer of Care Note  Patient: Jacob Powell  Procedure(s) Performed: LAPAROSCOPIC CHOLECYSTECTOMY WITH POSSIBLE INTRAOPERATIVE CHOLANGIOGRAM (N/A Abdomen)  Patient Location: PACU  Anesthesia Type:General  Level of Consciousness: awake, alert , oriented and patient cooperative  Airway & Oxygen Therapy: Patient Spontanous Breathing  Post-op Assessment: Report given to RN and Post -op Vital signs reviewed and stable  Post vital signs: Reviewed and stable  Last Vitals:  Vitals Value Taken Time  BP 132/83 01/24/2018 10:27 AM  Temp    Pulse 84 01/24/2018 10:27 AM  Resp 12 01/24/2018 10:27 AM  SpO2 99 % 01/24/2018 10:27 AM  Vitals shown include unvalidated device data.  Last Pain:  Vitals:   01/24/18 0700  TempSrc: Oral  PainSc:       Patients Stated Pain Goal: 0 (01/23/18 2342)  Complications: No apparent anesthesia complications

## 2018-01-24 NOTE — Interval H&P Note (Signed)
History and Physical Interval Note:  01/24/2018 8:13 AM  Jacob Powell  has presented today for surgery, with the diagnosis of cholecystitis  The various methods of treatment have been discussed with the patient and family. After consideration of risks, benefits and other options for treatment, the patient has consented to  Procedure(s): LAPAROSCOPIC CHOLECYSTECTOMY WITH POSSIBLE INTRAOPERATIVE CHOLANGIOGRAM (N/A) as a surgical intervention .  The patient's history has been reviewed, patient examined, no change in status, stable for surgery.  I have reviewed the patient's chart and labs.  Questions were answered to the patient's satisfaction.   Truth Barot A Rhian Asebedo

## 2018-01-24 NOTE — Anesthesia Postprocedure Evaluation (Signed)
Anesthesia Post Note  Patient: Jacob Powell  Procedure(s) Performed: LAPAROSCOPIC CHOLECYSTECTOMY WITH POSSIBLE INTRAOPERATIVE CHOLANGIOGRAM (N/A Abdomen)     Patient location during evaluation: PACU Anesthesia Type: General Level of consciousness: sedated Pain management: pain level controlled Vital Signs Assessment: post-procedure vital signs reviewed and stable Respiratory status: spontaneous breathing and respiratory function stable Cardiovascular status: stable Postop Assessment: no apparent nausea or vomiting Anesthetic complications: no    Last Vitals:  Vitals:   01/24/18 1107 01/24/18 1126  BP: 129/81 113/75  Pulse: 74 76  Resp: 16   Temp:  (!) 36.4 C  SpO2: 99% 96%                   Copelan Maultsby DANIEL

## 2018-01-24 NOTE — Progress Notes (Signed)
Pt received back from PACU s/p Lap chole, 4 lap sites with dermabond are clean dry and intact. Wife at bedside.  Pt was instructed on IS yesterday, to continue SCDs.  Rates his pain at a 8 right now, will give some morphine.

## 2018-01-24 NOTE — Progress Notes (Signed)
Preop CBC and CMET were not able to be obtained prior to pt going to [place] this am, called [facility] and they said it was OK and they could get a I stat down there.

## 2018-01-25 ENCOUNTER — Encounter (HOSPITAL_COMMUNITY): Payer: Self-pay | Admitting: *Deleted

## 2018-01-25 MED ORDER — OXYCODONE HCL 5 MG PO TABS: 5.0000 mg | ORAL_TABLET | Freq: Four times a day (QID) | ORAL | 0 refills | Status: DC | PRN

## 2018-01-25 NOTE — Discharge Instructions (Signed)
[facility] ______[facility] °LAPAROSCOPIC SURGERY: POST OP INSTRUCTIONS °Always review your discharge instruction sheet given to you by the facility where your surgery was performed. °IF YOU HAVE DISABILITY OR FAMILY LEAVE FORMS, YOU MUST BRING THEM TO THE OFFICE FOR PROCESSING.   °DO NOT GIVE THEM TO YOUR DOCTOR. ° °1. A prescription for pain medication may be given to you upon discharge.  Take your pain medication as prescribed, if needed.  If narcotic pain medicine is not needed, then you may take acetaminophen (Tylenol) or ibuprofen (Advil) as needed. °2. Take your usually prescribed medications unless otherwise directed. °3. If you need a refill on your pain medication, please contact your pharmacy.  They will contact our office to request authorization. Prescriptions will not be filled after 5pm or on week-ends. °4. You should follow a light diet the first few days after arrival home, such as soup and crackers, etc.  Be sure to include lots of fluids daily. °5. Most patients will experience some swelling and bruising in the area of the incisions.  Ice packs will help.  Swelling and bruising can take several days to resolve.  °6. It is common to experience some constipation if taking pain medication after surgery.  Increasing fluid intake and taking a stool softener (such as Colace) will usually help or prevent this problem from occurring.  A mild laxative (Milk of Magnesia or Miralax) should be taken according to package instructions if there are no bowel movements after 48 hours. °7. Unless discharge instructions indicate otherwise, you may remove your bandages 24-48 hours after surgery, and you may shower at that time.  You may have steri-strips (small skin tapes) in place directly over the incision.  These strips should be left on the skin for 7-10 days.  If your surgeon used skin glue on the incision, you may shower in 24 hours.  The glue will flake off over the next 2-3 weeks.  Any sutures or  staples will be removed at the office during your follow-up visit. °8. ACTIVITIES:  You may resume regular (light) daily activities beginning the next day--such as daily self-care, walking, climbing stairs--gradually increasing activities as tolerated.  You may have sexual intercourse when it is comfortable.  Refrain from any heavy lifting or straining until approved by your doctor. °a. You may drive when you are no longer taking prescription pain medication, you can comfortably wear a seatbelt, and you can safely maneuver your car and apply brakes. °b. RETURN TO WORK:  __________________________________________________________ °9. You should see your doctor in the office for a follow-up appointment approximately 2-3 weeks after your surgery.  Make sure that you call for this appointment within a day or two after you arrive home to insure a convenient appointment time. °10. OTHER INSTRUCTIONS: __________________________________________________________________________________________________________________________ __________________________________________________________________________________________________________________________ °WHEN TO CALL YOUR DOCTOR: °1. Fever over 101.0 °2. Inability to urinate °3. Continued bleeding from incision. °4. Increased pain, redness, or drainage from the incision. °5. Increasing abdominal pain ° °The clinic staff is available to answer your questions during regular business hours.  Please don’t hesitate to call and ask to speak to one of the nurses for clinical concerns.  If you have a medical emergency, go to the nearest emergency room or call 911.  A surgeon from [facility] is always on call at the hospital. °[address], [address], [place], [place]  [address] ? [address] [address], [place], [place]   [address] °[phone] ? [phone] ? FAX [phone] °Web site:   [email or web address] °

## 2018-01-25 NOTE — Discharge Summary (Signed)
Physician Discharge Summary  Patient ID: KOBE OFALLON MRN: [id number] DOB/AGE: 1970-10-14 47 y.o.  Admit date: 01/23/2018 Discharge date: 01/25/2018  Admission Diagnoses:  Acute cholecystitis  Discharge Diagnoses: same Principal Problem:   Acute cholecystitis s/p lap cholecystectomy 01/24/2018   Discharged Condition: good  Hospital Course: Admitted late 01/23/18 for acute cholecystitis.  Underwent lap chole with IOC on 01/24/18.  Doing better this morning.  Tolerating diet.  Sore.  Requiring oxycodone.  Ambulating.    Significant Diagnostic Studies: Dg Chest 2 View  Result Date: 01/23/2018 CLINICAL DATA:  Chest pain.  Hypertension. EXAM: CHEST - 2 VIEW COMPARISON:  None. FINDINGS: There is no edema or consolidation. The heart size and pulmonary vascularity are normal. No adenopathy. No bone lesions. No pneumothorax. IMPRESSION: No edema or consolidation. Electronically Signed   By: Bretta Bang III M.D.   On: 01/23/2018 08:30   Dg Cholangiogram Operative  Result Date: 01/24/2018 CLINICAL DATA:  Cholelithiasis EXAM: INTRAOPERATIVE CHOLANGIOGRAM TECHNIQUE: Cholangiographic images from the C-arm fluoroscopic device were submitted for interpretation post-operatively. Please see the procedural report for the amount of contrast utilized. FLUOROSCOPY TIME:  0 minutes 20 seconds. 1 acquired still image and 1 video sequence obtained COMPARISON:  None. FINDINGS: The gallbladder has been removed, and the cystic duct has been cannulated. The visualized intrahepatic biliary ducts appear normal in size and contour without evident mass or calculus. The common hepatic and common bile ducts appear normal without evident mass or calculus. Contrast flows freely from the common bile duct into the duodenum. There is contrast extravasation from the injection site. IMPRESSION: No biliary duct dilatation. No intrahepatic or extrahepatic biliary duct mass or calculus evident. Contrast flows freely into the  duodenum be of the common bile duct. Contrast extravasation from the cannulation site noted with contrast seen flowing into the lateral upper abdomen. Electronically Signed   By: Bretta Bang III M.D.   On: 01/24/2018 10:23   US Abdomen Limited  Result Date: 01/23/2018 CLINICAL DATA:  Epigastric pain. EXAM: ULTRASOUND ABDOMEN LIMITED RIGHT UPPER QUADRANT COMPARISON:  None. FINDINGS: Gallbladder: Cholelithiasis without pericholecystic fluid or gallbladder wall thickening. Largest gallstone measures 2.1 cm in the gallbladder neck which is non mobile. Negative sonographic Murphy sign. Common bile duct: Diameter: 4.3 mm Liver: No focal lesion identified. Increased hepatic parenchymal echogenicity. Portal vein is patent on color Doppler imaging with normal direction of blood flow towards the liver. IMPRESSION: 1. Cholelithiasis without sonographic evidence of acute cholecystitis. Large 2.1 cm gallstone impacted in the gallbladder neck. 2. Increased hepatic echogenicity as can be seen with hepatic steatosis. Electronically Signed   By: Elige Ko   On: 01/23/2018 11:11     Treatments: lap chole with IOC  Discharge Exam: Blood pressure 114/71, pulse 78, temperature 99 F (37.2 C), temperature source Oral, resp. rate 18, height 5\' 10"  (1.778 m), weight 113.3 kg, SpO2 96 %. General appearance: alert, cooperative and no distress GI: soft, incisional tenderness; Dermabond intact over all incisions  Disposition: Discharge disposition: 01-Home or Self Care       Discharge Instructions    Call MD for:  persistant nausea and vomiting   Complete by:  As directed    Call MD for:  redness, tenderness, or signs of infection (pain, swelling, redness, odor or green/yellow discharge around incision site)   Complete by:  As directed    Call MD for:  severe uncontrolled pain   Complete by:  As directed    Call MD for:  temperature >100.4  Complete by:  As directed    Diet general   Complete by:  As  directed    Driving Restrictions   Complete by:  As directed    Do not drive while taking pain medications   Increase activity slowly   Complete by:  As directed    May shower / Bathe   Complete by:  As directed      Allergies as of 01/25/2018   No Known Allergies     Medication List    TAKE these medications   acetaminophen 500 MG tablet Commonly known as:  TYLENOL Take 1,000 mg by mouth every 6 (six) hours as needed for headache.   amLODipine 5 MG tablet Commonly known as:  NORVASC Take 5 mg by mouth daily.   losartan-hydrochlorothiazide 100-12.5 MG tablet Commonly known as:  HYZAAR Take 1 tablet by mouth daily.   MENS MULTIVITAMIN PLUS PO Take 1 tablet by mouth daily.   oxyCODONE 5 MG immediate release tablet Commonly known as:  Oxy IR/ROXICODONE Take 1 tablet (5 mg total) by mouth every 6 (six) hours as needed for severe pain.        Signed: Wilmon Arms Dina Warbington 01/25/2018, 8:07 AM

## 2018-01-25 NOTE — Progress Notes (Signed)
Patient discharged to home. Verbalizes understanding of all discharge instructions including incision care, discharge medications, and follow up MD visits. Patient going home with wife.

## 2018-10-29 ENCOUNTER — Emergency Department (HOSPITAL_COMMUNITY): Payer: Managed Care, Other (non HMO)

## 2018-10-29 ENCOUNTER — Encounter (HOSPITAL_COMMUNITY): Payer: Self-pay

## 2018-10-29 ENCOUNTER — Emergency Department (HOSPITAL_COMMUNITY)
Admission: EM | Admit: 2018-10-29 | Discharge: 2018-10-30 | Disposition: A | Discharge status: Home / Self Care | Payer: Managed Care, Other (non HMO) | Attending: Emergency Medicine | Admitting: Emergency Medicine

## 2018-10-29 ENCOUNTER — Other Ambulatory Visit: Payer: Self-pay

## 2018-10-29 DIAGNOSIS — E860 Dehydration: Secondary | ICD-10-CM | POA: Insufficient documentation

## 2018-10-29 DIAGNOSIS — I10 Essential (primary) hypertension: Secondary | ICD-10-CM | POA: Insufficient documentation

## 2018-10-29 DIAGNOSIS — K5790 Diverticulosis of intestine, part unspecified, without perforation or abscess without bleeding: Secondary | ICD-10-CM | POA: Diagnosis not present

## 2018-10-29 DIAGNOSIS — R0789 Other chest pain: Secondary | ICD-10-CM

## 2018-10-29 DIAGNOSIS — R0602 Shortness of breath: Secondary | ICD-10-CM | POA: Diagnosis not present

## 2018-10-29 DIAGNOSIS — R531 Weakness: Secondary | ICD-10-CM | POA: Diagnosis not present

## 2018-10-29 DIAGNOSIS — E876 Hypokalemia: Secondary | ICD-10-CM | POA: Diagnosis not present

## 2018-10-29 DIAGNOSIS — R079 Chest pain, unspecified: Secondary | ICD-10-CM | POA: Diagnosis not present

## 2018-10-29 DIAGNOSIS — Z79899 Other long term (current) drug therapy: Secondary | ICD-10-CM | POA: Insufficient documentation

## 2018-10-29 DIAGNOSIS — R42 Dizziness and giddiness: Secondary | ICD-10-CM | POA: Insufficient documentation

## 2018-10-29 LAB — HEPATIC FUNCTION PANEL
ALT: 30 U/L (ref 0–44)
AST: 31 U/L (ref 15–41)
Albumin: 4.5 g/dL (ref 3.5–5.0)
Alkaline Phosphatase: 58 U/L (ref 38–126)
Bilirubin, Direct: 0.3 mg/dL — ABNORMAL HIGH (ref 0.0–0.2)
Indirect Bilirubin: 1 mg/dL — ABNORMAL HIGH (ref 0.3–0.9)
Total Bilirubin: 1.3 mg/dL — ABNORMAL HIGH (ref 0.3–1.2)
Total Protein: 7.8 g/dL (ref 6.5–8.1)

## 2018-10-29 LAB — URINALYSIS, ROUTINE W REFLEX MICROSCOPIC
Bilirubin Urine: NEGATIVE
Glucose, UA: NEGATIVE mg/dL
Hgb urine dipstick: NEGATIVE
Ketones, ur: NEGATIVE mg/dL
Leukocytes,Ua: NEGATIVE
Nitrite: NEGATIVE
Protein, ur: NEGATIVE mg/dL
Specific Gravity, Urine: >1.046 — ABNORMAL HIGH (ref 1.005–1.030)
pH: 5 (ref 5.0–8.0)

## 2018-10-29 LAB — BASIC METABOLIC PANEL
Anion gap: 15 (ref 5–15)
BUN: 24 mg/dL — ABNORMAL HIGH (ref 6–20)
CO2: 25 mmol/L (ref 22–32)
Calcium: 9.5 mg/dL (ref 8.9–10.3)
Chloride: 103 mmol/L (ref 98–111)
Creatinine, Ser: 1.46 mg/dL — ABNORMAL HIGH (ref 0.61–1.24)
GFR calc Af Amer: >60 mL/min (ref >60)
GFR calc non Af Amer: 56 mL/min — ABNORMAL LOW (ref >60)
Glucose, Bld: 132 mg/dL — ABNORMAL HIGH (ref 70–99)
Potassium: 3 mmol/L — ABNORMAL LOW (ref 3.5–5.1)
Sodium: 143 mmol/L (ref 135–145)

## 2018-10-29 LAB — CBC
HCT: 47.9 % (ref 39.0–52.0)
Hemoglobin: 16.7 g/dL (ref 13.0–17.0)
MCH: 30.2 pg (ref 26.0–34.0)
MCHC: 34.9 g/dL (ref 30.0–36.0)
MCV: 86.6 fL (ref 80.0–100.0)
Platelets: 298 10*3/uL (ref 150–400)
RBC: 5.53 MIL/uL (ref 4.22–5.81)
RDW: 12.6 % (ref 11.5–15.5)
WBC: 16.1 10*3/uL — ABNORMAL HIGH (ref 4.0–10.5)
nRBC: 0 % (ref 0.0–0.2)

## 2018-10-29 LAB — I-STAT CHEM 8, ED
BUN: 24 mg/dL — ABNORMAL HIGH (ref 6–20)
Calcium, Ion: 1.1 mmol/L — ABNORMAL LOW (ref 1.15–1.40)
Chloride: 102 mmol/L (ref 98–111)
Creatinine, Ser: 1.4 mg/dL — ABNORMAL HIGH (ref 0.61–1.24)
Glucose, Bld: 125 mg/dL — ABNORMAL HIGH (ref 70–99)
HCT: 49 % (ref 39.0–52.0)
Hemoglobin: 16.7 g/dL (ref 13.0–17.0)
Potassium: 2.8 mmol/L — ABNORMAL LOW (ref 3.5–5.1)
Sodium: 141 mmol/L (ref 135–145)
TCO2: 23 mmol/L (ref 22–32)

## 2018-10-29 LAB — TROPONIN I (HIGH SENSITIVITY)
Troponin I (High Sensitivity): <2 ng/L (ref <18)
Troponin I (High Sensitivity): <2 ng/L (ref <18)

## 2018-10-29 LAB — I-STAT CREATININE, ED: Creatinine, Ser: 1.4 mg/dL — ABNORMAL HIGH (ref 0.61–1.24)

## 2018-10-29 LAB — LIPASE, BLOOD: Lipase: 34 U/L (ref 11–51)

## 2018-10-29 MED ORDER — SODIUM CHLORIDE 0.9% FLUSH
3.0000 mL | Freq: Once | INTRAVENOUS | Status: AC
  Administered 2018-10-29: 3 mL via INTRAVENOUS

## 2018-10-29 MED ORDER — ONDANSETRON HCL 4 MG/2ML IJ SOLN
4.0000 mg | Freq: Once | INTRAMUSCULAR | Status: AC
  Administered 2018-10-29: 4 mg via INTRAVENOUS
  Filled 2018-10-29: qty 2

## 2018-10-29 MED ORDER — FENTANYL CITRATE (PF) 100 MCG/2ML IJ SOLN
100.0000 ug | INTRAMUSCULAR | Status: DC | PRN
  Administered 2018-10-29: 100 ug via INTRAVENOUS
  Filled 2018-10-29: qty 2

## 2018-10-29 MED ORDER — IOHEXOL 350 MG/ML SOLN
100.0000 mL | Freq: Once | INTRAVENOUS | Status: AC | PRN
  Administered 2018-10-29: 100 mL via INTRAVENOUS

## 2018-10-29 NOTE — ED Triage Notes (Addendum)
Pt presents to ED with complaints of chest pain, left sided numbness in hand which started 20-30 minutes ago. Pt also c/o dizziness and SOB. Pt states he is unable to move his left hand. Pt states pain is center of his chest and moved down to center of his abdomen.

## 2018-10-29 NOTE — ED Provider Notes (Signed)
  Face-to-face evaluation   History: He presents for upper abdominal pain radiating to his chest starting about an hour ago, after working outside all day.  He states  the pain is severe, and persistent.  He has never had this pain before.  He has history of hypertension.  No prior cardiac disease.  Physical exam: Alert, calm, uncomfortable.  Abdomen is soft.  He has moderate epigastric tenderness.  Bowel sounds are active.  There is no palpable mass.  Heart regular rate and rhythm.  Lungs clear anteriorly.  Medical screening examination/treatment/procedure(s) were conducted as a shared visit with non-physician practitioner(s) and myself.  I personally evaluated the patient during the encounter    Daleen Bo, MD 11/02/18 [phone]

## 2018-10-29 NOTE — ED Notes (Signed)
Wife Janace Hoard, [phone]

## 2018-10-29 NOTE — ED Provider Notes (Signed)
[facility] Provider Note   CSN: [id number] Arrival date & time: 10/29/18  1927    History   Chief Complaint Chief Complaint  Patient presents with  . Chest Pain    HPI Jacob Powell is a 48 y.o. male with a past medical history of hypertension who presents today for evaluation of sudden onset of severe chest and abdominal pain.  He reports that he had been outside today and was coming inside to eat dinner.  He had not started eating when he had sudden onset of severe 10 out of 10 mid lower chest/epigastric pain.  He reports that this started about half an hour prior to arrival.  He states that he had mild shortness of breath.  The pain started in his chest and is radiating down into his abdomen.  He denies eating prior to the onset of his pain.  He states that he had difficulty moving his left arm and hand, felt like his fingers were stuck straight.  This was some on the right side however significantly worse on the left side.  He denies nausea, vomiting, or diarrhea.  He reports feeling lightheaded.  He has never had anything like this before.  He reports his left arm is improving significantly.      HPI  Past Medical History:  Diagnosis Date  . Hypertension 12/28/2006    Patient Active Problem List   Diagnosis Date Noted  . Acute cholecystitis s/p lap cholecystectomy 01/24/2018 01/23/2018    Past Surgical History:  Procedure Laterality Date  . APPENDECTOMY    . CHOLECYSTECTOMY N/A 01/24/2018   Procedure: LAPAROSCOPIC CHOLECYSTECTOMY WITH POSSIBLE INTRAOPERATIVE CHOLANGIOGRAM;  Surgeon: Harriette Bouillonornett, Thomas, MD;  Location: [facility] OR;  Service: General;  Laterality: N/A;  . HERNIA REPAIR          Home Medications    Prior to Admission medications   Medication Sig Start Date End Date Taking? Authorizing Provider  acetaminophen (TYLENOL) 500 MG tablet Take 1,000 mg by mouth every 6 (six) hours as needed for headache.   Yes [provider]  amLODipine  (NORVASC) 10 MG tablet Take 10 mg by mouth daily. 10/07/18  Yes [provider]  hydrochlorothiazide (HYDRODIURIL) 25 MG tablet Take 25 mg by mouth daily. 10/06/18  Yes [provider]  losartan (COZAAR) 100 MG tablet Take 100 mg by mouth daily. 10/06/18  Yes [provider]  Multiple Vitamins-Minerals (MENS MULTIVITAMIN PLUS PO) Take 1 tablet by mouth daily.   Yes [provider]    Family History Family History  Family history unknown: Yes    Social History Social History   Tobacco Use  . Smoking status: Never Smoker  . Smokeless tobacco: Never Used  Substance Use Topics  . Alcohol use: Yes    Comment: occasional  . Drug use: Never     Allergies   Patient has no known allergies.   Review of Systems Review of Systems  Constitutional: Negative for chills and fever.  HENT: Negative for congestion.   Respiratory: Positive for shortness of breath.   Cardiovascular: Positive for chest pain.  Gastrointestinal: Positive for abdominal pain. Negative for diarrhea, nausea and vomiting.  Musculoskeletal: Negative for back pain and neck pain.  Skin: Negative for color change, rash and wound.  Neurological: Positive for weakness (Left arm) and light-headedness.  Psychiatric/Behavioral: Negative for confusion.  All other systems reviewed and are negative.    Physical Exam Updated Vital Signs BP 125/77   Pulse 73  Temp 97.6 F (36.4 C) (Oral)   Resp 18   Ht 5\' 10"  (1.778 m)   Wt 108.9 kg   SpO2 96%   BMI 34.44 kg/m   Physical Exam Vitals signs and nursing note reviewed.  Constitutional:      Appearance: He is well-developed. He is diaphoretic (Mild).     Comments: Appears uncomfortable  HENT:     Head: Normocephalic and atraumatic.  Eyes:     General: No scleral icterus.       Right eye: No discharge.        Left eye: No discharge.     Conjunctiva/sclera: Conjunctivae normal.  Neck:     Musculoskeletal: Normal range of motion.      Vascular: No JVD.     Trachea: No tracheal deviation.  Cardiovascular:     Rate and Rhythm: Normal rate and regular rhythm.     Pulses:          Radial pulses are 2+ on the right side and 2+ on the left side.       Dorsalis pedis pulses are 2+ on the right side and 2+ on the left side.       Posterior tibial pulses are 2+ on the right side and 2+ on the left side.     Heart sounds: Normal heart sounds. Heart sounds not distant.  Pulmonary:     Effort: Pulmonary effort is normal. No respiratory distress.     Breath sounds: Normal breath sounds. No stridor. No decreased breath sounds.  Chest:     Chest wall: No mass, deformity or tenderness.  Abdominal:     General: Bowel sounds are normal. There is no distension.     Palpations: Abdomen is soft.     Tenderness: There is abdominal tenderness (Generalized, primarily in epigastric and periumbilical areas.).  Musculoskeletal:        General: No deformity.     Right lower leg: He exhibits no tenderness. No edema.     Left lower leg: He exhibits no tenderness. No edema.  Skin:    General: Skin is warm.  Neurological:     General: No focal deficit present.     Mental Status: He is alert and oriented to person, place, and time.     Motor: No abnormal muscle tone.     Comments: 5/5 strength in bilateral upper and lower extremities.  No facial droop.  Speech is normal, he is able to provide coherent history.  Psychiatric:        Mood and Affect: Mood normal.        Behavior: Behavior normal.      ED Treatments / Results  Labs (all labs ordered are listed, but only abnormal results are displayed) Labs Reviewed  BASIC METABOLIC PANEL - Abnormal; Notable for the following components:      Result Value   Potassium 3.0 (*)    Glucose, Bld 132 (*)    BUN 24 (*)    Creatinine, Ser 1.46 (*)    GFR calc non Af Amer 56 (*)    All other components within normal limits  CBC - Abnormal; Notable for the following components:   WBC 16.1  (*)    All other components within normal limits  URINALYSIS, ROUTINE W REFLEX MICROSCOPIC - Abnormal; Notable for the following components:   Specific Gravity, Urine >1.046 (*)    All other components within normal limits  HEPATIC FUNCTION PANEL - Abnormal; Notable for the following  components:   Total Bilirubin 1.3 (*)    Bilirubin, Direct 0.3 (*)    Indirect Bilirubin 1.0 (*)    All other components within normal limits  I-STAT CHEM 8, ED - Abnormal; Notable for the following components:   Potassium 2.8 (*)    BUN 24 (*)    Creatinine, Ser 1.40 (*)    Glucose, Bld 125 (*)    Calcium, Ion 1.10 (*)    All other components within normal limits  I-STAT CREATININE, ED - Abnormal; Notable for the following components:   Creatinine, Ser 1.40 (*)    All other components within normal limits  TROPONIN I (HIGH SENSITIVITY)  TROPONIN I (HIGH SENSITIVITY)  LIPASE, BLOOD  TROPONIN I (HIGH SENSITIVITY)  CK    EKG EKG Interpretation  Date/Time:  Friday October 29 2018 19:34:23 EDT Ventricular Rate:  70 PR Interval:    QRS Duration: 105 QT Interval:  405 QTC Calculation: 437 R Axis:   51 Text Interpretation:  Sinus rhythm since last tracing no significant change Confirmed by Mancel Bale [id number] on 10/29/2018 8:00:20 PM   Radiology Dg Chest 2 View  Result Date: 10/29/2018 CLINICAL DATA:  Chest pain. EXAM: CHEST - 2 VIEW COMPARISON:  Radiographs of January 15, 2017. FINDINGS: The heart size and mediastinal contours are within normal limits. Both lungs are clear. No pneumothorax or pleural effusion is noted. The visualized skeletal structures are unremarkable. IMPRESSION: No active cardiopulmonary disease. Electronically Signed   By: Lupita Raider M.D.   On: 10/29/2018 20:49   Ct Angio Chest/abd/pel For Dissection W And/or W/wo  Result Date: 10/29/2018 CLINICAL DATA:  Acute chest and back pain, aortic dissection suspected. EXAM: CT ANGIOGRAPHY CHEST, ABDOMEN AND PELVIS TECHNIQUE:  Multidetector CT imaging through the chest, abdomen and pelvis was performed using the standard protocol during bolus administration of intravenous contrast. Multiplanar reconstructed images and MIPs were obtained and reviewed to evaluate the vascular anatomy. CONTRAST:  OMNIPAQUE IOHEXOL 350 MG/ML SOLN COMPARISON:  Radiograph earlier this day. FINDINGS: CTA CHEST FINDINGS Cardiovascular: Thoracic aorta is normal in caliber. No aneurysm, dissection, acute aortic syndrome or aortic hematoma. Conventional branching pattern from the aortic arch. No filling defects in the central pulmonary arteries to the lobar level. Heart is normal in size. No pericardial effusion. Mediastinum/Nodes: No enlarged mediastinal or hilar lymph nodes. Esophagus is decompressed. No thyroid nodule. Lungs/Pleura: Minor subsegmental atelectasis in the left lower lobe. No confluent consolidation, pulmonary edema, or pleural fluid. No pulmonary mass. Musculoskeletal: There are no acute or suspicious osseous abnormalities. Review of the MIP images confirms the above findings. CTA ABDOMEN AND PELVIS FINDINGS VASCULAR Aorta: Normal in caliber. No dissection, aneurysm, or vasculitis. Celiac: Widely patent without dissection, aneurysm, or vasculitis. SMA: Widely patent without dissection, aneurysm, or vasculitis. Renals: Small bilateral accessory renal arteries. Main renal arteries are patent without dissection or vasculitis. IMA: Widely patent without aneurysm, dissection, or vasculitis. Inflow: Patent without evidence of aneurysm, dissection, vasculitis or significant stenosis. Veins: No obvious venous abnormality within the limitations of this arterial phase study. Review of the MIP images confirms the above findings. NON-VASCULAR Hepatobiliary: Post cholecystectomy. No focal hepatic abnormality. Pancreas: No ductal dilatation or inflammation. Spleen: Normal in size and arterial enhancement. Adrenals/Urinary Tract: Normal adrenal glands. No  hydronephrosis or perinephric edema. 5 mm nonobstructing stone in the lower right kidney. Both ureters are decompressed. Urinary bladder is nondistended and not well evaluated. Stomach/Bowel: Colonic diverticulosis without diverticulitis. Mild submucosal fatty infiltration of the distal small bowel  and ascending colon suggesting prior inflammation. Post appendectomy. No bowel obstruction, acute inflammation, or evident wall thickening. Stomach is nondistended. Lymphatic: No enlarged lymph nodes in the abdomen or pelvis. Small ileocolic nodes are likely reactive. Reproductive: Prostate is unremarkable. Other: Fat within both inguinal canals. Small fat containing umbilical hernia. No free air or ascites. No intra-abdominal fluid collection. Musculoskeletal: Mild degenerative change in the lumbar spine. There are no acute or suspicious osseous abnormalities. Review of the MIP images confirms the above findings. IMPRESSION: 1. Normal thoracoabdominal aorta without dissection or acute aortic abnormality. 2. No acute abnormality in the chest, abdomen, or pelvis. 3. Incidental note of nonobstructing right renal stone. Colonic diverticulosis without diverticulitis. Electronically Signed   By: Keith Rake M.D.   On: 10/29/2018 21:41    Procedures Procedures (including critical care time)  Medications Ordered in ED Medications  fentaNYL (SUBLIMAZE) injection 100 mcg (100 mcg Intravenous Given 10/29/18 1946)  sodium chloride 0.9 % bolus 1,000 mL (has no administration in time range)  potassium chloride SA (K-DUR) CR tablet 40 mEq (has no administration in time range)  sodium chloride flush (NS) 0.9 % injection 3 mL (3 mLs Intravenous Given 10/29/18 1947)  ondansetron (ZOFRAN) injection 4 mg (4 mg Intravenous Given 10/29/18 1946)  iohexol (OMNIPAQUE) 350 MG/ML injection 100 mL (100 mLs Intravenous Contrast Given 10/29/18 2111)     Initial Impression / Assessment and Plan / ED Course  I have reviewed the triage  vital signs and the nursing notes.  Pertinent labs & imaging results that were available during my care of the patient were reviewed by me and considered in my medical decision making (see chart for details).       Patient presents today for evaluation of sudden onset severe mid chest pain radiating into his abdomen.  On initial exam he appeared uncomfortable, constantly moving in the bed in an attempt to get comfortable.  He was tachypneic however was able to be coached to slow his breathing.  While he reported that he had weakness in his left arm earlier that he reports is currently resolving.  He has 5/5 grip strength in bilateral hands without pronator drift.  No weakness in bilateral lower extremities.  Suspect carpal pedal spasms secondary to hyperventilation from pain, however given combination of chest pain, abdominal pain and the sudden onset and severity of his pain along with hypertension, CTA chest, abdomen, pelvis dissection series was ordered.  This showed evidence of diverticulosis without evidence of dissection, or other cause for his pain.  I-STAT labs were obtained prior to CT scan.  EKG does not show evidence of ischemia.  High-sensitivity troponin is not elevated.  CBC shows leukocytosis of 16, I suspect that this is reactive.  BMP shows hypokalemia with a potassium of 3.0.  P.o. replacement was ordered.  His creatinine is slightly elevated at 1.46 with a GFR of 56.  Bilirubin is slightly elevated, lipase is normal.  Urine is significantly concentrated.  Suspect dehydration is the primary cause of patient's symptoms secondary to heat exposure as it was in the 90s today.  His pain was treated with fentanyl, and his nausea was treated with Zofran.  Patient was seen as a shared visit with Dr. Eulis Foster.  At shift change care was transferred to Dr. Eliane Decree who will follow pending studies, re-evaulate and determine disposition.      Final Clinical Impressions(s) / ED Diagnoses   Final  diagnoses:  Atypical chest pain  Dehydration  Hypokalemia  ED Discharge Orders    None       Norman ClayHammond,  W, PA-C 10/30/18 0040    Mancel BaleWentz, Elliott, MD 11/02/18 [phone]

## 2018-10-29 NOTE — ED Notes (Signed)
Patient in gown and on cardiac monitoring at this time. EKG done and given to Dr Eulis Foster

## 2018-10-30 LAB — CK: Total CK: 86 U/L (ref 49–397)

## 2018-10-30 LAB — TROPONIN I (HIGH SENSITIVITY): Troponin I (High Sensitivity): 3 ng/L (ref <18)

## 2018-10-30 MED ORDER — SODIUM CHLORIDE 0.9 % IV BOLUS
1000.0000 mL | Freq: Once | INTRAVENOUS | Status: AC
  Administered 2018-10-30: 1000 mL via INTRAVENOUS

## 2018-10-30 MED ORDER — OMEPRAZOLE 20 MG PO CPDR: 20.0000 mg | DELAYED_RELEASE_CAPSULE | Freq: Every day | ORAL | 0 refills | Status: DC

## 2018-10-30 MED ORDER — POTASSIUM CHLORIDE CRYS ER 20 MEQ PO TBCR
40.0000 meq | EXTENDED_RELEASE_TABLET | Freq: Once | ORAL | Status: AC
  Administered 2018-10-30: 40 meq via ORAL
  Filled 2018-10-30: qty 2

## 2018-10-30 NOTE — ED Provider Notes (Signed)
Patient was left at change of shift to get results of his delta troponin.  He states he had central chest pain that also went into his epigastric area.  He states the pain is gone now but he just has a "funny" feeling in his chest.  He denies any burning or reflux symptoms.  He states he is never had this pain before.  He states he has had peptic ulcer disease before and has been treated with Pepcid.  He denies any family history of coronary artery disease but does have hypertension.  He denies high cholesterol.  He does not smoke.  He is status post cholecystectomy.   Heart score 2, safe to be discharged home for outpatient follow-up.   Results for orders placed or performed during the hospital encounter of 10/29/18  Basic metabolic panel  Result Value Ref Range   Sodium 143 135 - 145 mmol/L   Potassium 3.0 (L) 3.5 - 5.1 mmol/L   Chloride 103 98 - 111 mmol/L   CO2 25 22 - 32 mmol/L   Glucose, Bld 132 (H) 70 - 99 mg/dL   BUN 24 (H) 6 - 20 mg/dL   Creatinine, Ser 1.611.46 (H) 0.61 - 1.24 mg/dL   Calcium 9.5 8.9 - 09.610.3 mg/dL   GFR calc non Af Amer 56 (L) >60 mL/min   GFR calc Af Amer >60 >60 mL/min   Anion gap 15 5 - 15  CBC  Result Value Ref Range   WBC 16.1 (H) 4.0 - 10.5 K/uL   RBC 5.53 4.22 - 5.81 MIL/uL   Hemoglobin 16.7 13.0 - 17.0 g/dL   HCT 04.547.9 40.939.0 - 81.152.0 %   MCV 86.6 80.0 - 100.0 fL   MCH 30.2 26.0 - 34.0 pg   MCHC 34.9 30.0 - 36.0 g/dL   RDW 91.412.6 78.211.5 - 95.615.5 %   Platelets 298 150 - 400 K/uL   nRBC 0.0 0.0 - 0.2 %  Troponin I (High Sensitivity)  Result Value Ref Range   Troponin I (High Sensitivity) <2.0 <18 ng/L  Troponin I (High Sensitivity)  Result Value Ref Range   Troponin I (High Sensitivity) <2.0 <18 ng/L  Urinalysis, Routine w reflex microscopic  Result Value Ref Range   Color, Urine YELLOW YELLOW   APPearance CLEAR CLEAR   Specific Gravity, Urine >1.046 (H) 1.005 - 1.030   pH 5.0 5.0 - 8.0   Glucose, UA NEGATIVE NEGATIVE mg/dL   Hgb urine dipstick NEGATIVE  NEGATIVE   Bilirubin Urine NEGATIVE NEGATIVE   Ketones, ur NEGATIVE NEGATIVE mg/dL   Protein, ur NEGATIVE NEGATIVE mg/dL   Nitrite NEGATIVE NEGATIVE   Leukocytes,Ua NEGATIVE NEGATIVE  Lipase, blood  Result Value Ref Range   Lipase 34 11 - 51 U/L  Hepatic function panel  Result Value Ref Range   Total Protein 7.8 6.5 - 8.1 g/dL   Albumin 4.5 3.5 - 5.0 g/dL   AST 31 15 - 41 U/L   ALT 30 0 - 44 U/L   Alkaline Phosphatase 58 38 - 126 U/L   Total Bilirubin 1.3 (H) 0.3 - 1.2 mg/dL   Bilirubin, Direct 0.3 (H) 0.0 - 0.2 mg/dL   Indirect Bilirubin 1.0 (H) 0.3 - 0.9 mg/dL  Troponin I (High Sensitivity)  Result Value Ref Range   Troponin I (High Sensitivity) 3.00 <18 ng/L  CK  Result Value Ref Range   Total CK 86 49 - 397 U/L  I-stat chem 8, ED [facility])  Result Value  Ref Range   Sodium 141 135 - 145 mmol/L   Potassium 2.8 (L) 3.5 - 5.1 mmol/L   Chloride 102 98 - 111 mmol/L   BUN 24 (H) 6 - 20 mg/dL   Creatinine, Ser 8.111.40 (H) 0.61 - 1.24 mg/dL   Glucose, Bld 914125 (H) 70 - 99 mg/dL   Calcium, Ion 7.821.10 (L) 1.15 - 1.40 mmol/L   TCO2 23 22 - 32 mmol/L   Hemoglobin 16.7 13.0 - 17.0 g/dL   HCT 95.649.0 21.339.0 - 08.652.0 %  I-stat Creatinine, ED  Result Value Ref Range   Creatinine, Ser 1.40 (H) 0.61 - 1.24 mg/dL   Delta troponin is negative  Dg Chest 2 View  Result Date: 10/29/2018 CLINICAL DATA:  Chest pain. EXAM: CHEST - 2 VIEW COMPARISON:  Radiographs of January 15, 2017. FINDINGS: The heart size and mediastinal contours are within normal limits. Both lungs are clear. No pneumothorax or pleural effusion is noted. The visualized skeletal structures are unremarkable. IMPRESSION: No active cardiopulmonary disease. Electronically Signed   By: Lupita RaiderJames  Green Jr M.D.   On: 10/29/2018 20:49   Ct Angio Chest/abd/pel For Dissection W And/or W/wo  Result Date: 10/29/2018 CLINICAL DATA:  Acute chest and back pain, aortic dissection suspected. EXAM: CT ANGIOGRAPHY CHEST, ABDOMEN AND PELVIS  TECHNIQUE: Multidetector CT imaging through the chest, abdomen and pelvis was performed using the standard protocol during bolus administration of intravenous contrast. Multiplanar reconstructed images and MIPs were obtained and reviewed to evaluate the vascular anatomy. CONTRAST:  100mL OMNIPAQUE IOHEXOL 350 MG/ML SOLN COMPARISON:  Radiograph earlier this day. FINDINGS: CTA CHEST FINDINGS Cardiovascular: Thoracic aorta is normal in caliber. No aneurysm, dissection, acute aortic syndrome or aortic hematoma. Conventional branching pattern from the aortic arch. No filling defects in the central pulmonary arteries to the lobar level. Heart is normal in size. No pericardial effusion. Mediastinum/Nodes: No enlarged mediastinal or hilar lymph nodes. Esophagus is decompressed. No thyroid nodule. Lungs/Pleura: Minor subsegmental atelectasis in the left lower lobe. No confluent consolidation, pulmonary edema, or pleural fluid. No pulmonary mass. Musculoskeletal: There are no acute or suspicious osseous abnormalities. Review of the MIP images confirms the above findings. CTA ABDOMEN AND PELVIS FINDINGS VASCULAR Aorta: Normal in caliber. No dissection, aneurysm, or vasculitis. Celiac: Widely patent without dissection, aneurysm, or vasculitis. SMA: Widely patent without dissection, aneurysm, or vasculitis. Renals: Small bilateral accessory renal arteries. Main renal arteries are patent without dissection or vasculitis. IMA: Widely patent without aneurysm, dissection, or vasculitis. Inflow: Patent without evidence of aneurysm, dissection, vasculitis or significant stenosis. Veins: No obvious venous abnormality within the limitations of this arterial phase study. Review of the MIP images confirms the above findings. NON-VASCULAR Hepatobiliary: Post cholecystectomy. No focal hepatic abnormality. Pancreas: No ductal dilatation or inflammation. Spleen: Normal in size and arterial enhancement. Adrenals/Urinary Tract: Normal adrenal  glands. No hydronephrosis or perinephric edema. 5 mm nonobstructing stone in the lower right kidney. Both ureters are decompressed. Urinary bladder is nondistended and not well evaluated. Stomach/Bowel: Colonic diverticulosis without diverticulitis. Mild submucosal fatty infiltration of the distal small bowel and ascending colon suggesting prior inflammation. Post appendectomy. No bowel obstruction, acute inflammation, or evident wall thickening. Stomach is nondistended. Lymphatic: No enlarged lymph nodes in the abdomen or pelvis. Small ileocolic nodes are likely reactive. Reproductive: Prostate is unremarkable. Other: Fat within both inguinal canals. Small fat containing umbilical hernia. No free air or ascites. No intra-abdominal fluid collection. Musculoskeletal: Mild degenerative change in the lumbar spine. There are no acute or suspicious osseous  abnormalities. Review of the MIP images confirms the above findings. IMPRESSION: 1. Normal thoracoabdominal aorta without dissection or acute aortic abnormality. 2. No acute abnormality in the chest, abdomen, or pelvis. 3. Incidental note of nonobstructing right renal stone. Colonic diverticulosis without diverticulitis. Electronically Signed   By: Keith Rake M.D.   On: 10/29/2018 21:41    EKG Interpretation  Date/Time:  Friday October 29 2018 19:34:23 EDT Ventricular Rate:  70 PR Interval:    QRS Duration: 105 QT Interval:  405 QTC Calculation: 437 R Axis:   51 Text Interpretation:  Sinus rhythm since last tracing no significant change Confirmed by Daleen Bo [phone]) on 10/29/2018 8:00:20 PM        Patient was started on PPI, he should follow-up with his primary care doctor to see if he needs to have a stress test done.  Diagnoses that have been ruled out:  None  Diagnoses that are still under consideration:  None  Final diagnoses:  Atypical chest pain  Dehydration  Hypokalemia  Diverticulosis  Nonspecific chest pain   ED Discharge  Orders         Ordered    omeprazole (PRILOSEC) 20 MG capsule  Daily     10/30/18 0216          Plan discharge  Rolland Porter, MD, Barbette Or, MD 10/30/18 [phone]

## 2018-10-30 NOTE — Discharge Instructions (Addendum)
Today you received medications that may make you sleepy or impair your ability to make decisions.  For the next 24 hours please do not drive, operate heavy machinery, care for a small child with out another adult present, or perform any activities that may cause harm to you or someone else if you were to fall asleep or be impaired.   Please start the Prilosec as prescribed.  Let Dr. Juel Burrow office know about your ED visit.  He may want you to get an outpatient stress test done to be thorough.  Check if you get worse again.

## 2019-01-25 ENCOUNTER — Other Ambulatory Visit: Payer: Self-pay

## 2019-01-25 ENCOUNTER — Encounter: Payer: Self-pay | Admitting: Cardiology

## 2019-01-25 ENCOUNTER — Ambulatory Visit: Payer: Managed Care, Other (non HMO) | Admitting: Cardiology

## 2019-01-25 VITALS — BP 142/88 | HR 99 | Temp 98.2°F | Ht 70.0 in | Wt 249.0 lb

## 2019-01-25 DIAGNOSIS — R079 Chest pain, unspecified: Secondary | ICD-10-CM

## 2019-01-25 DIAGNOSIS — I10 Essential (primary) hypertension: Secondary | ICD-10-CM

## 2019-01-25 DIAGNOSIS — Z87898 Personal history of other specified conditions: Secondary | ICD-10-CM

## 2019-01-25 NOTE — Progress Notes (Signed)
Cardiology Office Note  Date: 01/25/2019   ID: [name], DOB 11-09-1970, MRN [id number]  PCP:  Benita Stabile, MD  Consulting Cardiologist: Jonelle Sidle, MD Electrophysiologist:  None   Chief Complaint  Patient presents with  . History of chest pain    History of Present Illness: Jacob Powell is a 48 y.o. male referred for cardiology consultation by Mr. [name] with a history of chest pain.  I reviewed available records. He was seen in the ER back in July with chest discomfort also moving into the epigastric region. He ruled out for ACS with negative high-sensitivity troponin I levels and ECG did not show acute ST segment changes.  He underwent CT imaging of the chest, abdomen, and pelvis that did not show any evidence of significant aortic disease, no acute abnormalities, and incidental findings of a nonobstructing right renal stone.  Also diverticulosis without diverticulitis.  We discussed his symptoms today.  He states that he had a similar episode last year and had his gallbladder out at that time.  He does not report any reproducible exertional chest pain but states that occasionally he feels a "tightness" but does not limit him.  No sense of palpitations or unusual breathlessness, no syncope.  He does not report any significant family history of early heart disease.  He has been on antihypertensive medications for several years.  Past Medical History:  Diagnosis Date  . Hypertension 12/28/2006    Past Surgical History:  Procedure Laterality Date  . APPENDECTOMY    . CHOLECYSTECTOMY N/A 01/24/2018   Procedure: LAPAROSCOPIC CHOLECYSTECTOMY WITH POSSIBLE INTRAOPERATIVE CHOLANGIOGRAM;  Surgeon: Harriette Bouillon, MD;  Location: [facility] OR;  Service: General;  Laterality: N/A;  . HERNIA REPAIR      Current Outpatient Medications  Medication Sig Dispense Refill  . acetaminophen (TYLENOL) 500 MG tablet Take 1,000 mg by mouth every 6 (six) hours as needed for headache.     Marland Kitchen amLODipine (NORVASC) 10 MG tablet Take 10 mg by mouth daily.    . hydrochlorothiazide (HYDRODIURIL) 25 MG tablet Take 25 mg by mouth daily.    Marland Kitchen losartan (COZAAR) 100 MG tablet Take 100 mg by mouth daily.    . Multiple Vitamins-Minerals (MENS MULTIVITAMIN PLUS PO) Take 1 tablet by mouth daily.     No current facility-administered medications for this visit.    Allergies:  Patient has no known allergies.   Social History: The patient  reports that he has never smoked. He has never used smokeless tobacco. He reports current alcohol use. He reports that he does not use drugs.   Family History: The patient's Family history is unknown by patient.   ROS:  Please see the history of present illness. Otherwise, complete review of systems is positive for none.  All other systems are reviewed and negative.   Physical Exam: VS:  BP (!) 142/88   Pulse 99   Temp 98.2 F (36.8 C)   Ht 5\' 10"  (1.778 m)   Wt 249 lb (112.9 kg)   SpO2 97%   BMI 35.73 kg/m , BMI Body mass index is 35.73 kg/m.  Wt Readings from Last 3 Encounters:  01/25/19 249 lb (112.9 kg)  10/29/18 240 lb (108.9 kg)  01/23/18 249 lb 12.5 oz (113.3 kg)    General: Patient appears comfortable at rest. HEENT: Conjunctiva and lids normal, wearing a mask. Neck: Supple, no elevated JVP or carotid bruits, no thyromegaly. Lungs: Clear to auscultation, nonlabored breathing at rest.  Cardiac: Regular rate and rhythm, no S3 or significant systolic murmur, no pericardial rub. Abdomen: Soft, nontender, bowel sounds present. Extremities: No pitting edema, distal pulses 2+. Skin: Warm and dry. Musculoskeletal: No kyphosis. Neuropsychiatric: Alert and oriented x3, affect grossly appropriate.  ECG:  An ECG dated 10/29/2018 was personally reviewed today and demonstrated:  Sinus rhythm.  Recent Labwork: 10/29/2018: ALT 30; AST 31; BUN 24; Creatinine, Ser 1.40; Hemoglobin 16.7; Platelets 298; Potassium 2.8; Sodium 141   Other Studies Reviewed  Today:  CT chest, abdomen, pelvis 10/29/2018: IMPRESSION: 1. Normal thoracoabdominal aorta without dissection or acute aortic abnormality. 2. No acute abnormality in the chest, abdomen, or pelvis. 3. Incidental note of nonobstructing right renal stone. Colonic diverticulosis without diverticulitis.  Assessment and Plan:  1.  History of recurrent chest pain, no episodes to the degree of what he experienced back in July.  At that time his ECG showed no acute ST segment changes and high-sensitivity troponin I levels were negative.  No aortic pathology by CT imaging.  He does have a longstanding history of hypertension.  Plan is to obtain a [place] for ischemic evaluation.  2.  Essential hypertension, systolic is in the 159Y today.  He continues on Norvasc, Cozaar, and HCTZ.  Keep follow-up with PCP.  Medication Adjustments/Labs and Tests Ordered: Current medicines are reviewed at length with the patient today.  Concerns regarding medicines are outlined above.   Tests Ordered: Orders Placed This Encounter  Procedures  . NM Myocar Multi W/Spect W/Wall Motion / EF    Medication Changes: No orders of the defined types were placed in this encounter.   Disposition:  Follow up test results.  Signed, Satira Sark, MD, [facility] 01/25/2019 3:19 PM    [place] [facility] [address] [place], [place] [address] Phone: [phone]; Fax: [phone]

## 2019-01-25 NOTE — Patient Instructions (Signed)
Medication Instructions: Your physician recommends that you continue on your current medications as directed. Please refer to the Current Medication list given to you today.  Labwork: None today  Procedures/Testing: Your physician has requested that you have a lexiscan myoview. For further information please visit [email or web address]. Please follow instruction sheet, as given.    Follow-Up:  We will call you with results  Any Additional Special Instructions Will Be Listed Below (If Applicable).     If you need a refill on your cardiac medications before your next appointment, please call your pharmacy.      Thank you for choosing [facility] !        

## 2019-02-02 ENCOUNTER — Encounter (HOSPITAL_COMMUNITY): Payer: Managed Care, Other (non HMO)

## 2019-02-09 ENCOUNTER — Other Ambulatory Visit: Payer: Self-pay | Admitting: *Deleted

## 2019-02-09 DIAGNOSIS — Z20822 Contact with and (suspected) exposure to covid-19: Secondary | ICD-10-CM

## 2019-02-12 LAB — NOVEL CORONAVIRUS, NAA: SARS-CoV-2, NAA: DETECTED — AB

## 2019-02-16 ENCOUNTER — Other Ambulatory Visit: Payer: Self-pay

## 2019-02-16 DIAGNOSIS — Z20822 Contact with and (suspected) exposure to covid-19: Secondary | ICD-10-CM

## 2019-02-17 LAB — NOVEL CORONAVIRUS, NAA: SARS-CoV-2, NAA: NOT DETECTED

## 2019-06-30 ENCOUNTER — Ambulatory Visit: Payer: Managed Care, Other (non HMO) | Attending: Internal Medicine

## 2019-06-30 DIAGNOSIS — Z23 Encounter for immunization: Secondary | ICD-10-CM | POA: Insufficient documentation

## 2019-06-30 NOTE — Progress Notes (Signed)
   Covid-19 Vaccination Clinic  Name:  Jacob Powell    MRN: [id number] DOB: [birth year]  06/30/2019  Jacob Powell was observed post Covid-19 immunization for 15 minutes without incident. He was provided with Vaccine Information Sheet and instruction to access the V-Safe system.   Jacob Powell was instructed to call 911 with any severe reactions post vaccine: Marland Kitchen Difficulty breathing  . Swelling of face and throat  . A fast heartbeat  . A bad rash all over body  . Dizziness and weakness   Immunizations Administered    Name Date Dose VIS Date Route   Moderna COVID-19 Vaccine 06/30/2019  9:08 AM 0.5 mL 03/29/2019 Intramuscular   Manufacturer: Moderna   Lot: [id number]   NDC: 00525-910-28

## 2019-08-03 ENCOUNTER — Ambulatory Visit: Payer: Managed Care, Other (non HMO) | Attending: Internal Medicine

## 2019-08-03 DIAGNOSIS — Z23 Encounter for immunization: Secondary | ICD-10-CM

## 2019-08-03 NOTE — Progress Notes (Signed)
   Covid-19 Vaccination Clinic  Name:  DEMARKIS GHEEN    MRN: [id number] DOB: Jan 12, 1971  08/03/2019  Mr. Depoy was observed post Covid-19 immunization for 15 minutes without incident. He was provided with Vaccine Information Sheet and instruction to access the V-Safe system.   Mr. Ghrist was instructed to call 911 with any severe reactions post vaccine: Marland Kitchen Difficulty breathing  . Swelling of face and throat  . A fast heartbeat  . A bad rash all over body  . Dizziness and weakness   Immunizations Administered    Name Date Dose VIS Date Route   Moderna COVID-19 Vaccine 08/03/2019  8:07 AM 0.5 mL 03/29/2019 Intramuscular   Manufacturer: Gala Murdoch   Lot: [id number]   NDC: 11941-740-81

## 2020-04-05 ENCOUNTER — Encounter: Payer: Self-pay | Admitting: *Deleted

## 2020-04-19 ENCOUNTER — Ambulatory Visit: Payer: BC Managed Care – PPO | Admitting: Podiatry

## 2020-04-19 ENCOUNTER — Ambulatory Visit (INDEPENDENT_AMBULATORY_CARE_PROVIDER_SITE_OTHER): Payer: BC Managed Care – PPO

## 2020-04-19 ENCOUNTER — Encounter: Payer: Self-pay | Admitting: Podiatry

## 2020-04-19 ENCOUNTER — Other Ambulatory Visit: Payer: Self-pay

## 2020-04-19 DIAGNOSIS — M779 Enthesopathy, unspecified: Secondary | ICD-10-CM

## 2020-04-19 DIAGNOSIS — M79672 Pain in left foot: Secondary | ICD-10-CM | POA: Diagnosis not present

## 2020-04-19 DIAGNOSIS — M722 Plantar fascial fibromatosis: Secondary | ICD-10-CM | POA: Diagnosis not present

## 2020-04-19 MED ORDER — MELOXICAM 15 MG PO TABS: 15.0000 mg | ORAL_TABLET | Freq: Every day | ORAL | 0 refills | Status: DC

## 2020-04-19 MED ORDER — METHYLPREDNISOLONE 4 MG PO TBPK: ORAL_TABLET | ORAL | 0 refills | Status: DC

## 2020-04-19 NOTE — Patient Instructions (Signed)
You can start with the medrol dose pack (steroid) and once complete you can start the meloxicam (Mobic)  Plantar Fasciitis (Heel Spur Syndrome) with Rehab The plantar fascia is a fibrous, ligament-like, soft-tissue structure that spans the bottom of the foot. Plantar fasciitis is a condition that causes pain in the foot due to inflammation of the tissue. SYMPTOMS   Pain and tenderness on the underneath side of the foot.  Pain that worsens with standing or walking. CAUSES  Plantar fasciitis is caused by irritation and injury to the plantar fascia on the underneath side of the foot. Common mechanisms of injury include:  Direct trauma to bottom of the foot.  Damage to a small nerve that runs under the foot where the main fascia attaches to the heel bone.  Stress placed on the plantar fascia due to bone spurs. RISK INCREASES WITH:   Activities that place stress on the plantar fascia (running, jumping, pivoting, or cutting).  Poor strength and flexibility.  Improperly fitted shoes.  Tight calf muscles.  Flat feet.  Failure to warm-up properly before activity.  Obesity. PREVENTION  Warm up and stretch properly before activity.  Allow for adequate recovery between workouts.  Maintain physical fitness:  Strength, flexibility, and endurance.  Cardiovascular fitness.  Maintain a health body weight.  Avoid stress on the plantar fascia.  Wear properly fitted shoes, including arch supports for individuals who have flat feet.  PROGNOSIS  If treated properly, then the symptoms of plantar fasciitis usually resolve without surgery. However, occasionally surgery is necessary.  RELATED COMPLICATIONS   Recurrent symptoms that may result in a chronic condition.  Problems of the lower back that are caused by compensating for the injury, such as limping.  Pain or weakness of the foot during push-off following surgery.  Chronic inflammation, scarring, and partial or complete  fascia tear, occurring more often from repeated injections.  TREATMENT  Treatment initially involves the use of ice and medication to help reduce pain and inflammation. The use of strengthening and stretching exercises may help reduce pain with activity, especially stretches of the Achilles tendon. These exercises may be performed at home or with a therapist. Your caregiver may recommend that you use heel cups of arch supports to help reduce stress on the plantar fascia. Occasionally, corticosteroid injections are given to reduce inflammation. If symptoms persist for greater than 6 months despite non-surgical (conservative), then surgery may be recommended.   MEDICATION   If pain medication is necessary, then nonsteroidal anti-inflammatory medications, such as aspirin and ibuprofen, or other minor pain relievers, such as acetaminophen, are often recommended.  Do not take pain medication within 7 days before surgery.  Prescription pain relievers may be given if deemed necessary by your caregiver. Use only as directed and only as much as you need.  Corticosteroid injections may be given by your caregiver. These injections should be reserved for the most serious cases, because they may only be given a certain number of times.  HEAT AND COLD  Cold treatment (icing) relieves pain and reduces inflammation. Cold treatment should be applied for 10 to 15 minutes every 2 to 3 hours for inflammation and pain and immediately after any activity that aggravates your symptoms. Use ice packs or massage the area with a piece of ice (ice massage).  Heat treatment may be used prior to performing the stretching and strengthening activities prescribed by your caregiver, physical therapist, or athletic trainer. Use a heat pack or soak the injury in warm water.  SEEK IMMEDIATE MEDICAL CARE IF:  Treatment seems to offer no benefit, or the condition worsens.  Any medications produce adverse side  effects.  EXERCISES- RANGE OF MOTION (ROM) AND STRETCHING EXERCISES - Plantar Fasciitis (Heel Spur Syndrome) These exercises may help you when beginning to rehabilitate your injury. Your symptoms may resolve with or without further involvement from your physician, physical therapist or athletic trainer. While completing these exercises, remember:   Restoring tissue flexibility helps normal motion to return to the joints. This allows healthier, less painful movement and activity.  An effective stretch should be held for at least 30 seconds.  A stretch should never be painful. You should only feel a gentle lengthening or release in the stretched tissue.  RANGE OF MOTION - Toe Extension, Flexion  Sit with your right / left leg crossed over your opposite knee.  Grasp your toes and gently pull them back toward the top of your foot. You should feel a stretch on the bottom of your toes and/or foot.  Hold this stretch for 10 seconds.  Now, gently pull your toes toward the bottom of your foot. You should feel a stretch on the top of your toes and or foot.  Hold this stretch for 10 seconds. Repeat  times. Complete this stretch 3 times per day.   RANGE OF MOTION - Ankle Dorsiflexion, Active Assisted  Remove shoes and sit on a chair that is preferably not on a carpeted surface.  Place right / left foot under knee. Extend your opposite leg for support.  Keeping your heel down, slide your right / left foot back toward the chair until you feel a stretch at your ankle or calf. If you do not feel a stretch, slide your bottom forward to the edge of the chair, while still keeping your heel down.  Hold this stretch for 10 seconds. Repeat 3 times. Complete this stretch 2 times per day.   STRETCH  Gastroc, Standing  Place hands on wall.  Extend right / left leg, keeping the front knee somewhat bent.  Slightly point your toes inward on your back foot.  Keeping your right / left heel on the floor  and your knee straight, shift your weight toward the wall, not allowing your back to arch.  You should feel a gentle stretch in the right / left calf. Hold this position for 10 seconds. Repeat 3 times. Complete this stretch 2 times per day.  STRETCH  Soleus, Standing  Place hands on wall.  Extend right / left leg, keeping the other knee somewhat bent.  Slightly point your toes inward on your back foot.  Keep your right / left heel on the floor, bend your back knee, and slightly shift your weight over the back leg so that you feel a gentle stretch deep in your back calf.  Hold this position for 10 seconds. Repeat 3 times. Complete this stretch 2 times per day.  STRETCH  Gastrocsoleus, Standing  Note: This exercise can place a lot of stress on your foot and ankle. Please complete this exercise only if specifically instructed by your caregiver.   Place the ball of your right / left foot on a step, keeping your other foot firmly on the same step.  Hold on to the wall or a rail for balance.  Slowly lift your other foot, allowing your body weight to press your heel down over the edge of the step.  You should feel a stretch in your right / left calf.  Hold this position for 10 seconds.  Repeat this exercise with a slight bend in your right / left knee. Repeat 3 times. Complete this stretch 2 times per day.   STRENGTHENING EXERCISES - Plantar Fasciitis (Heel Spur Syndrome)  These exercises may help you when beginning to rehabilitate your injury. They may resolve your symptoms with or without further involvement from your physician, physical therapist or athletic trainer. While completing these exercises, remember:   Muscles can gain both the endurance and the strength needed for everyday activities through controlled exercises.  Complete these exercises as instructed by your physician, physical therapist or athletic trainer. Progress the resistance and repetitions only as  guided.  STRENGTH - Towel Curls  Sit in a chair positioned on a non-carpeted surface.  Place your foot on a towel, keeping your heel on the floor.  Pull the towel toward your heel by only curling your toes. Keep your heel on the floor. Repeat 3 times. Complete this exercise 2 times per day.  STRENGTH - Ankle Inversion  Secure one end of a rubber exercise band/tubing to a fixed object (table, pole). Loop the other end around your foot just before your toes.  Place your fists between your knees. This will focus your strengthening at your ankle.  Slowly, pull your big toe up and in, making sure the band/tubing is positioned to resist the entire motion.  Hold this position for 10 seconds.  Have your muscles resist the band/tubing as it slowly pulls your foot back to the starting position. Repeat 3 times. Complete this exercises 2 times per day.  Document Released: 04/14/2005 Document Revised: 07/07/2011 Document Reviewed: 07/27/2008 [facility] Patient Information 2014 [place], [place].

## 2020-04-23 NOTE — Progress Notes (Signed)
Subjective:   Patient ID: Jacob Powell, male   DOB: 49 y.o.   MRN: [id number]   HPI  49 year old male presents the office for concerns of left heel pain, ankle pain which has been ongoing for last 2 months.  He states that the heel that swelling on the side of his foot and ankle at times as well.  He has pain in the bottom of the heel as well as the lateral aspect of the ankle.  Describes occasional numbness of the toes as well.  No rating pain or weakness.  He does work third shift and that is when the symptoms to his foot started to get worse.  He denies any recent injury or falls.  No other concerns today.  Review of Systems  All other systems reviewed and are negative.  Past Medical History:  Diagnosis Date  . Hypertension 12/28/2006    Past Surgical History:  Procedure Laterality Date  . APPENDECTOMY    . CHOLECYSTECTOMY N/A 01/24/2018   Procedure: LAPAROSCOPIC CHOLECYSTECTOMY WITH POSSIBLE INTRAOPERATIVE CHOLANGIOGRAM;  Surgeon: Harriette Bouillon, MD;  Location: [facility] OR;  Service: General;  Laterality: N/A;  . HERNIA REPAIR       Current Outpatient Medications:  .  acetaminophen (TYLENOL) 500 MG tablet, Take 1,000 mg by mouth every 6 (six) hours as needed for headache., Disp: , Rfl:  .  amLODipine (NORVASC) 10 MG tablet, Take 10 mg by mouth daily., Disp: , Rfl:  .  losartan-hydrochlorothiazide (HYZAAR) 100-25 MG tablet, Take 1 tablet by mouth daily., Disp: , Rfl:  .  meloxicam (MOBIC) 15 MG tablet, Take 1 tablet (15 mg total) by mouth daily., Disp: 30 tablet, Rfl: 0 .  methylPREDNISolone (MEDROL DOSEPAK) 4 MG TBPK tablet, Take as directed, Disp: 21 tablet, Rfl: 0 .  Multiple Vitamins-Minerals (MENS MULTIVITAMIN PLUS PO), Take 1 tablet by mouth daily., Disp: , Rfl:   No Known Allergies       Objective:  Physical Exam  General: AAO x3, NAD  Dermatological: Skin is warm, dry and supple bilateral.  There are no open sores, no preulcerative lesions, no rash or signs of  infection present.  Vascular: Dorsalis Pedis artery and Posterior Tibial artery pedal pulses are 2/4 bilateral with immedate capillary fill time. There is no pain with calf compression, swelling, warmth, erythema.   Neruologic: Grossly intact via light touch bilateral.  Negative L-spine.  Musculoskeletal: Tenderness palpation on plantar medial tubercle of the calcaneus at insertion of plantar fascia.  The plantar fascia appears to be intact.  There is no pain with compression of calcaneus.  No pain with Achilles tendon.  Mild discomfort on the course the peroneal tendon as well.  Mild edema.  Flexor, extensor tendons appear to be intact.  Muscular strength 5/5 in all groups tested bilateral.  Gait: Unassisted, Nonantalgic.       Assessment:   Heel pain, plantar fasciitis with ankle tendinitis    Plan:  -Treatment options discussed including all alternatives, risks, and complications -Etiology of symptoms were discussed -X-rays were obtained and reviewed with the patient.  No evidence of acute fracture or stress fracture identified today. -Trilock ankle brace -Medrol dose pack-once complete can start meloxicam -Discussed stretching, icing daily. -Discussed inserts for shoes   Vivi Barrack DPM

## 2020-05-17 ENCOUNTER — Other Ambulatory Visit: Payer: BC Managed Care – PPO | Admitting: Orthotics

## 2020-05-17 ENCOUNTER — Ambulatory Visit: Payer: BC Managed Care – PPO | Admitting: Podiatry

## 2020-05-21 ENCOUNTER — Ambulatory Visit (INDEPENDENT_AMBULATORY_CARE_PROVIDER_SITE_OTHER): Payer: Self-pay | Admitting: *Deleted

## 2020-05-21 ENCOUNTER — Other Ambulatory Visit: Payer: Self-pay

## 2020-05-21 VITALS — Ht 70.0 in | Wt 242.8 lb

## 2020-05-21 DIAGNOSIS — Z1211 Encounter for screening for malignant neoplasm of colon: Secondary | ICD-10-CM

## 2020-05-21 MED ORDER — PEG 3350-KCL-NA BICARB-NACL 420 G PO SOLR: 4000.0000 mL | Freq: Once | ORAL | 0 refills | Status: AC

## 2020-05-21 NOTE — Progress Notes (Addendum)
Gastroenterology Pre-Procedure Review  Request Date: 05/21/2020 Requesting Physician: Dr. Dwana Melena, no previous TCS, family hx of colon cancer (uncle)  PATIENT REVIEW QUESTIONS: The patient responded to the following health history questions as indicated:    1. Diabetes Melitis: no 2. Joint replacements in the past 12 months: no 3. Major health problems in the past 3 months: no 4. Has an artificial valve or MVP: no 5. Has a defibrillator: no 6. Has been advised in past to take antibiotics in advance of a procedure like teeth cleaning: no 7. Family history of colon cancer: yes, uncle: age 46's  8. Alcohol Use: yes, 2 beers a month 9. Illicit drug Use: no 10. History of sleep apnea: no  11. History of coronary artery or other vascular stents placed within the last 12 months: no 12. History of any prior anesthesia complications: no 13. Body mass index is 34.84 kg/m.    MEDICATIONS & ALLERGIES:    Patient reports the following regarding taking any blood thinners:   Plavix? no Aspirin? no Coumadin? no Brilinta? no Xarelto? no Eliquis? no Pradaxa? no Savaysa? no Effient? no  Patient confirms/reports the following medications:  Current Outpatient Medications  Medication Sig Dispense Refill  . acetaminophen (TYLENOL) 500 MG tablet Take 1,000 mg by mouth as needed for headache.    Marland Kitchen amLODipine (NORVASC) 10 MG tablet Take 10 mg by mouth daily.    Marland Kitchen losartan-hydrochlorothiazide (HYZAAR) 100-25 MG tablet Take 1 tablet by mouth daily.    . Multiple Vitamins-Minerals (MENS MULTIVITAMIN PLUS PO) Take 1 tablet by mouth daily.     No current facility-administered medications for this visit.    Patient confirms/reports the following allergies:  No Known Allergies  No orders of the defined types were placed in this encounter.   AUTHORIZATION INFORMATION Primary Insurance: [employer],  [id number] #:[id number] ,  Group #: [id number] Pre-Cert / Auth required: No, file to local [employer]  SCHEDULE  INFORMATION: Procedure has been scheduled as follows:  Date: 07/20/2020, Time: AM procedure Location: [facility] with Dr. Marletta Lor  This Gastroenterology Pre-Precedure Review Form is being routed to the following provider(s): Tana Coast, PA

## 2020-05-21 NOTE — Progress Notes (Signed)
Ok to schedule.  ASA II 

## 2020-05-21 NOTE — Patient Instructions (Addendum)
Jacob Powell   15-May-1970 MRN: [id number] Procedure Date: 07/20/2020  Arrival Time:   You will receive a call from the hospital a few days before your procedure.     Location of Procedure: [facility] Short Stay  PREPARATION FOR COLONOSCOPY WITH TRI-LYTE PREP  Please notify us immediately if you are diabetic, take iron supplements, or if you are on Coumadin or any other blood thinners.   Please hold the following medications: n/a   PROCEDURE IS SCHEDULED FOR Jacob Powell AS FOLLOWS:  Procedure Date: 07/20/2020  Time to register: You will receive a call from the hospital a few days before your procedure. Place to register: [facility] Short Stay Scheduled provider: Dr. Marletta Lor   2 DAYS BEFORE PROCEDURE:  DATE: 07/18/2020  DAY: Wednesday Begin clear liquid diet AFTER your lunch meal. NO SOLID FOODS!   1 DAY BEFORE PROCEDURE:  DATE: 07/19/2020   DAY: Thursday  Continue clear liquids the entire day - NO SOLID FOOD.   Diabetic medications adjustments for today: n/a  At 12:00pm (noon): Take 2 (two) Dulcolax (Bisacodyl) tablets  At 2:00pm: Start drinking your solution. Try to drink 1 (one) 8 ounce glass every 10-15 minutes, until you have consumed HALF the jug. (You should complete the first 1/2 of the jug in 2 hours. Wait 30 minutes, then drink 3-4 more glasses of the solution. Your stools should be clear; if not, you may have to consume the rest of the jug.   One hour after completing the solution: take the last 2 (two) Dulcolax (Bisacodyl) tablets, with a clear liquid.  YOU MUST DRINK PLENTY OF CLEAR LIQUIDS DURING YOUR PREP TO REDUCE RISKS OF KIDNEY FAILURE.   Continue clear liquids only, until midnight. Do not eat or drink anything after midnight.  EXCEPTION:  If you take medications for your heart, blood pressure or breathing, you may take these medications with a small amount of clear liquid.      DAY OF PROCEDURE:   DATE: 07/20/2020      DAY:  Friday The morning of your procedure give yourself 1 (one) Fleet Enema, at least 1 hour before going to the hospital.   You may take Tylenol products. Please continue your regular medications unless we have instructed otherwise.   Diabetic medications adjustments for today. n/a  Someone MUST be available to drive you home; the hospital will cancel this appointment if you do not have a driver.   Please call the office if you have any questions (Dept: [phone]).  Please see below for Dietary Information.  CLEAR LIQUIDS INCLUDE:  Water Jello (NOT red in color)   Ice Popsicles (NOT red in color)   Tea (sugar ok, no milk/cream) Powdered fruit flavored drinks  Coffee (sugar ok, no milk/cream) Gatorade/ Lemonade/ Kool-Aid  (NOT red in color)   Juice: apple, white grape, white cranberry Soft drinks  Clear bullion, consomme, broth (fat free beef/chicken/vegetable)  Carbonated beverages (any kind)  Strained chicken noodle soup Hard Candy   REMEMBER: Clear liquids are liquids that will allow you to see your fingers on the other side of a clear glass. Be sure liquids are NOT red in color, and not cloudy, but CLEAR.   DO NOT EAT OR DRINK ANY OF THE FOLLOWING:  Dairy products of any kind   Cranberry juice Tomato juice / V8 juice   Grapefruit juice Orange juice     Red grape juice  Do not eat any solid  foods, including such foods as: cereal, oatmeal, yogurt, fruits, vegetables, creamed soups, eggs, bread, etc.    HELPFUL HINTS FOR DRINKING PREP SOLUTION:   Make sure prep is extremely cold. Refrigerate the night before. You may also put in the freezer.   You may try mixing some Crystal Light or Country Time Lemonade if you prefer. Mix in small amounts; add more if necessary.  Try drinking through a straw  Rinse mouth with water or a mouthwash between glasses, to remove after-taste.  Try sipping on a cold beverage /ice/ popsicles between glasses of prep  Place a piece of sugar-free  hard candy in mouth between glasses  If you become nauseated, try consuming smaller amounts, or stretch out the time between glasses. Stop for 30-60 minutes, then slowly start back drinking    You may call the office (Dept: [phone]) before 5:00pm, or page the doctor on call after 5:00pm ([phone]), for further instructions, if necessary.   OTHER INSTRUCTIONS  You will need a responsible adult at least 51 years of age to accompany you and drive you home. This person must remain in the waiting room during your procedure.  Wear loose fitting clothing that is easily removed.  Leave jewelry and other valuables at home.   Remove all body piercing jewelry and leave at home.  Total time from sign-in until discharge is approximately 2-3 hours.  You should go home directly after your procedure and rest. You can resume normal activities the day after your procedure.  The day of your procedure you should not:  Drive  Make legal decisions  Operate machinery  Drink alcohol  Return to work

## 2020-05-24 ENCOUNTER — Ambulatory Visit: Payer: BC Managed Care – PPO | Admitting: Orthotics

## 2020-05-24 ENCOUNTER — Ambulatory Visit: Payer: BC Managed Care – PPO | Admitting: Podiatry

## 2020-05-24 ENCOUNTER — Telehealth: Payer: Self-pay | Admitting: *Deleted

## 2020-05-24 ENCOUNTER — Other Ambulatory Visit: Payer: Self-pay

## 2020-05-24 DIAGNOSIS — M79672 Pain in left foot: Secondary | ICD-10-CM | POA: Diagnosis not present

## 2020-05-24 DIAGNOSIS — M779 Enthesopathy, unspecified: Secondary | ICD-10-CM

## 2020-05-24 DIAGNOSIS — M722 Plantar fascial fibromatosis: Secondary | ICD-10-CM | POA: Diagnosis not present

## 2020-05-24 NOTE — Progress Notes (Signed)
Patient picked up f/o and was pleased with fit, comfort, and function.  Worked well with footwear.  Told of rbeak in period and how to report any issues.  

## 2020-05-24 NOTE — Patient Instructions (Signed)

## 2020-05-24 NOTE — Telephone Encounter (Signed)
Lmom for pt to call me back. 

## 2020-05-25 NOTE — Telephone Encounter (Signed)
Left another message for pt to call me back.

## 2020-05-27 NOTE — Progress Notes (Signed)
Subjective: 50 year old male presents the office in follow-up evaluation of heel pain, plantar fasciitis, peroneal tendinitis.  Overall he states he is doing better and he presents to pick up orthotics as well.  Denies any recent injury or trauma sinusotomy no swelling.  Overall the pain is improved.Denies any systemic complaints such as fevers, chills, nausea, vomiting. No acute changes since last appointment, and no other complaints at this time.   Objective: AAO x3, NAD DP/PT pulses palpable bilaterally, CRT less than 3 seconds There is mild discomfort on the plantar medial tubercle of the calcaneus with insertion of plantar fashion.  There is no pain with lateral compression of calcaneus.  No significant discomfort along the course the peroneal tendon.  No pain along the Achilles tendon.  No edema no erythema.  MMT 5/5. No pain with calf compression, swelling, warmth, erythema  Assessment: 50 year old male with plantar fasciitis, peroneal tendinitis  Plan: -All treatment options discussed with the patient including all alternatives, risks, complications.  -Offered steroid injection today but we hold off on this as he is working [employer] and will be on his feet all night.  Will likely do this next appointment if needed.  Continue stretching, icing daily.  Wean off a Tri-Lock ankle brace.  Orthotics were dispensed today by Raiford Noble, our pedorthotist, and break-in instructions provided orally and written. -Patient encouraged to call the office with any questions, concerns, change in symptoms.   Vivi Barrack DPM

## 2020-05-29 NOTE — Telephone Encounter (Signed)
Spoke to pt.  He scheduled his Covid screening for 07/18/2020 and his procedure for 07/20/2020.  He is aware that Day Surgery with contact him regarding procedure time.  Pt aware that I am mailing out prep instructions.  Pt voiced understanding.

## 2020-06-28 ENCOUNTER — Other Ambulatory Visit: Payer: Self-pay

## 2020-06-28 ENCOUNTER — Ambulatory Visit: Payer: BC Managed Care – PPO | Admitting: Podiatry

## 2020-06-28 DIAGNOSIS — M722 Plantar fascial fibromatosis: Secondary | ICD-10-CM

## 2020-06-28 DIAGNOSIS — M779 Enthesopathy, unspecified: Secondary | ICD-10-CM

## 2020-06-28 MED ORDER — TRIAMCINOLONE ACETONIDE 10 MG/ML IJ SUSP
10.0000 mg | Freq: Once | INTRAMUSCULAR | Status: AC
  Administered 2020-06-28: 10 mg

## 2020-06-28 MED ORDER — IBUPROFEN 800 MG PO TABS: 800.0000 mg | ORAL_TABLET | Freq: Three times a day (TID) | ORAL | 0 refills | Status: DC | PRN

## 2020-07-04 DIAGNOSIS — M722 Plantar fascial fibromatosis: Secondary | ICD-10-CM | POA: Insufficient documentation

## 2020-07-04 DIAGNOSIS — M779 Enthesopathy, unspecified: Secondary | ICD-10-CM | POA: Insufficient documentation

## 2020-07-04 NOTE — Progress Notes (Signed)
Subjective: 50 year old male presents the office in follow-up evaluation of heel pain, plantar fasciitis, peroneal tendinitis.  States he has been doing better.  He is awaiting orthotics.  He still gets some discomfort to the heel but gets some swelling to the ankle.  No recent injury or falls or any changes otherwise. Denies any systemic complaints such as fevers, chills, nausea, vomiting. No acute changes since last appointment, and no other complaints at this time.   Objective: AAO x3, NAD DP/PT pulses palpable bilaterally, CRT less than 3 seconds There is mild discomfort on the plantar medial tubercle of the calcaneus with insertion of plantar fashion.  There is no pain with lateral compression of calcaneus.  There is minimal discomfort along the course the peroneal tendon however there is some edema to the area.  No pain along the Achilles tendon.  No edema no erythema.  MMT 5/5. No pain with calf compression, swelling, warmth, erythema  Assessment: 50 year old male with plantar fasciitis, peroneal tendinitis  Plan: -All treatment options discussed with the patient including all alternatives, risks, complications.  -Steroid injection performed.  See procedure note below.  Continue the brace as well as stretching, icing daily.  Continue orthotics.  If no improvement will order MRI  Procedure: Injection Tendon/Ligament Discussed alternatives, risks, complications and verbal consent was obtained.  Location: LEFT plantar fascia at the glabrous junction; medial approach. Skin Prep: Alcohol  Injectate: 0.5cc 0.5% marcaine plain, 0.5 cc 2% lidocaine plain and, 1 cc kenalog 10. Disposition: Patient tolerated procedure well. Injection site dressed with a band-aid.  Post-injection care was discussed and return precautions discussed.   Return in about 6 weeks (around 08/09/2020).  Vivi Barrack DPM

## 2020-07-17 ENCOUNTER — Telehealth: Payer: Self-pay

## 2020-07-17 NOTE — Telephone Encounter (Signed)
Melanie at [facility] endo called office, they have been unable to reach pt to give him arrival time for TCS 07/20/20. Arrival time is 10:45am.  Tried to call pt, LMOVM for return call.  Routing to [employer].

## 2020-07-17 NOTE — Telephone Encounter (Signed)
Pt called back and confirmed his Covid screening for tomorrow and was given his procedure arrival time (10:45 am)  for 07/20/2020.

## 2020-07-17 NOTE — Telephone Encounter (Signed)
Left another message for pt to call us back

## 2020-07-18 ENCOUNTER — Other Ambulatory Visit (HOSPITAL_COMMUNITY)
Admission: RE | Admit: 2020-07-18 | Discharge: 2020-07-18 | Disposition: A | Discharge status: Home / Self Care | Payer: BC Managed Care – PPO | Source: Ambulatory Visit | Attending: Internal Medicine | Admitting: Internal Medicine

## 2020-07-18 ENCOUNTER — Other Ambulatory Visit: Payer: Self-pay

## 2020-07-18 DIAGNOSIS — Z20822 Contact with and (suspected) exposure to covid-19: Secondary | ICD-10-CM | POA: Insufficient documentation

## 2020-07-18 DIAGNOSIS — Z01812 Encounter for preprocedural laboratory examination: Secondary | ICD-10-CM | POA: Insufficient documentation

## 2020-07-18 DIAGNOSIS — K648 Other hemorrhoids: Secondary | ICD-10-CM | POA: Diagnosis not present

## 2020-07-18 DIAGNOSIS — Z800 Family history of malignant neoplasm of digestive organs: Secondary | ICD-10-CM | POA: Diagnosis not present

## 2020-07-18 DIAGNOSIS — Z1211 Encounter for screening for malignant neoplasm of colon: Secondary | ICD-10-CM | POA: Diagnosis not present

## 2020-07-18 DIAGNOSIS — K635 Polyp of colon: Secondary | ICD-10-CM | POA: Diagnosis not present

## 2020-07-18 DIAGNOSIS — I10 Essential (primary) hypertension: Secondary | ICD-10-CM | POA: Diagnosis not present

## 2020-07-18 DIAGNOSIS — Z9049 Acquired absence of other specified parts of digestive tract: Secondary | ICD-10-CM | POA: Diagnosis not present

## 2020-07-18 DIAGNOSIS — K5730 Diverticulosis of large intestine without perforation or abscess without bleeding: Secondary | ICD-10-CM | POA: Diagnosis not present

## 2020-07-18 LAB — SARS CORONAVIRUS 2 (TAT 6-24 HRS): SARS Coronavirus 2: NEGATIVE

## 2020-07-20 ENCOUNTER — Ambulatory Visit (HOSPITAL_COMMUNITY): Payer: BC Managed Care – PPO | Admitting: Certified Registered"

## 2020-07-20 ENCOUNTER — Other Ambulatory Visit: Payer: Self-pay

## 2020-07-20 ENCOUNTER — Ambulatory Visit (HOSPITAL_COMMUNITY)
Admission: RE | Admit: 2020-07-20 | Discharge: 2020-07-20 | Disposition: A | Discharge status: Home / Self Care | Payer: BC Managed Care – PPO | Attending: Internal Medicine | Admitting: Internal Medicine

## 2020-07-20 ENCOUNTER — Encounter (HOSPITAL_COMMUNITY): Payer: Self-pay

## 2020-07-20 ENCOUNTER — Encounter (HOSPITAL_COMMUNITY)
Admission: RE | Disposition: A | Discharge status: Home / Self Care | Payer: Self-pay | Source: Home / Self Care | Attending: Internal Medicine

## 2020-07-20 DIAGNOSIS — Z1211 Encounter for screening for malignant neoplasm of colon: Secondary | ICD-10-CM | POA: Insufficient documentation

## 2020-07-20 DIAGNOSIS — K648 Other hemorrhoids: Secondary | ICD-10-CM | POA: Insufficient documentation

## 2020-07-20 DIAGNOSIS — Z20822 Contact with and (suspected) exposure to covid-19: Secondary | ICD-10-CM | POA: Insufficient documentation

## 2020-07-20 DIAGNOSIS — I10 Essential (primary) hypertension: Secondary | ICD-10-CM | POA: Insufficient documentation

## 2020-07-20 DIAGNOSIS — Z800 Family history of malignant neoplasm of digestive organs: Secondary | ICD-10-CM | POA: Insufficient documentation

## 2020-07-20 DIAGNOSIS — K5730 Diverticulosis of large intestine without perforation or abscess without bleeding: Secondary | ICD-10-CM | POA: Insufficient documentation

## 2020-07-20 DIAGNOSIS — K635 Polyp of colon: Secondary | ICD-10-CM | POA: Insufficient documentation

## 2020-07-20 DIAGNOSIS — Z9049 Acquired absence of other specified parts of digestive tract: Secondary | ICD-10-CM | POA: Insufficient documentation

## 2020-07-20 HISTORY — DX: Personal history of urinary calculi: Z87.442

## 2020-07-20 HISTORY — PX: POLYPECTOMY: SHX5525

## 2020-07-20 HISTORY — PX: COLONOSCOPY WITH PROPOFOL: SHX5780

## 2020-07-20 LAB — BASIC METABOLIC PANEL
Anion gap: 10 (ref 5–15)
BUN: 14 mg/dL (ref 6–20)
CO2: 24 mmol/L (ref 22–32)
Calcium: 9 mg/dL (ref 8.9–10.3)
Chloride: 103 mmol/L (ref 98–111)
Creatinine, Ser: 0.98 mg/dL (ref 0.61–1.24)
GFR, Estimated: >60 mL/min (ref >60)
Glucose, Bld: 100 mg/dL — ABNORMAL HIGH (ref 70–99)
Potassium: 3.3 mmol/L — ABNORMAL LOW (ref 3.5–5.1)
Sodium: 137 mmol/L (ref 135–145)

## 2020-07-20 SURGERY — COLONOSCOPY WITH PROPOFOL
Anesthesia: General

## 2020-07-20 MED ORDER — PROPOFOL 500 MG/50ML IV EMUL
INTRAVENOUS | Status: DC | PRN
  Administered 2020-07-20: 150 ug/kg/min via INTRAVENOUS

## 2020-07-20 MED ORDER — STERILE WATER FOR IRRIGATION IR SOLN
Status: DC | PRN
  Administered 2020-07-20: 100 mL

## 2020-07-20 MED ORDER — CHLORHEXIDINE GLUCONATE CLOTH 2 % EX PADS: 6.0000 | MEDICATED_PAD | Freq: Once | CUTANEOUS | Status: DC

## 2020-07-20 MED ORDER — PROPOFOL 10 MG/ML IV BOLUS
INTRAVENOUS | Status: DC | PRN
  Administered 2020-07-20: 100 mg via INTRAVENOUS
  Administered 2020-07-20: 50 mg via INTRAVENOUS

## 2020-07-20 MED ORDER — LACTATED RINGERS IV SOLN
INTRAVENOUS | Status: DC
  Administered 2020-07-20: 1000 mL via INTRAVENOUS

## 2020-07-20 NOTE — Op Note (Signed)
[facility] Patient Name: Jacob Powell Procedure Date: 07/20/2020 11:17 AM MRN: [id number] Date of Birth: 10-02-1970 Attending MD: Elon Alas. Abbey Chatters DO CSN: [id number] Age: 50 Admit Type: Outpatient Procedure:                Colonoscopy Indications:              Screening for colorectal malignant neoplasm Providers:                Elon Alas. Abbey Chatters, DO, Lambert Mody, Dereck Leep, Technician Referring MD:              Medicines:                Monitored Anesthesia Care Complications:            No immediate complications. Estimated Blood Loss:     Estimated blood loss was minimal. Procedure:                Pre-Anesthesia Assessment:                           - The anesthesia plan was to use monitored                            anesthesia care (MAC).                           After obtaining informed consent, the colonoscope                            was passed under direct vision. Throughout the                            procedure, the patient's blood pressure, pulse, and                            oxygen saturations were monitored continuously. The                            [id number]-HQ190L ([id number]) scope was introduced through                            the anus and advanced to the the cecum, identified                            by appendiceal orifice and ileocecal valve. The                            colonoscopy was performed without difficulty. The                            patient tolerated the procedure well. The quality                            of the bowel preparation was  evaluated using the                            BBPS [facility] Bowel Preparation Scale) with scores                            of: Right Colon = 2 (minor amount of residual                            staining, small fragments of stool and/or opaque                            liquid, but mucosa seen well), Transverse Colon = 3                            (entire  mucosa seen well with no residual staining,                            small fragments of stool or opaque liquid) and Left                            Colon = 3 (entire mucosa seen well with no residual                            staining, small fragments of stool or opaque                            liquid). The total BBPS score equals 8. The quality                            of the bowel preparation was good. Scope In: 11:33:00 AM Scope Out: 11:47:04 AM Scope Withdrawal Time: 0 hours 10 minutes 56 seconds  Total Procedure Duration: 0 hours 14 minutes 4 seconds  Findings:      The perianal and digital rectal examinations were normal.      Non-bleeding internal hemorrhoids were found during endoscopy.      Multiple small-mouthed diverticula were found in the sigmoid colon.      A 4 mm polyp was found in the descending colon. The polyp was sessile.       The polyp was removed with a cold snare. Resection and retrieval were       complete.      The exam was otherwise without abnormality. Impression:               - Non-bleeding internal hemorrhoids.                           - Diverticulosis in the sigmoid colon.                           - One 4 mm polyp in the descending colon, removed                            with a cold snare. Resected and retrieved.                           -  The examination was otherwise normal. Moderate Sedation:      Per Anesthesia Care Recommendation:           - Patient has a contact number available for                            emergencies. The signs and symptoms of potential                            delayed complications were discussed with the                            patient. Return to normal activities tomorrow.                            Written discharge instructions were provided to the                            patient.                           - Resume previous diet.                           - Continue present medications.                            - Await pathology results.                           - Repeat colonoscopy in 7-10 years for surveillance                            based on pathology results.                           - Return to GI clinic PRN. Procedure Code(s):        --- Professional ---                           [phone], Colonoscopy, flexible; with removal of                            tumor(s), polyp(s), or other lesion(s) by snare                            technique Diagnosis Code(s):        --- Professional ---                           Z12.11, Encounter for screening for malignant                            neoplasm of colon                           K64.8, Other hemorrhoids  K63.5, Polyp of colon                           K57.30, Diverticulosis of large intestine without                            perforation or abscess without bleeding CPT copyright 2019 American Medical Association. All rights reserved. The codes documented in this report are preliminary and upon coder review may  be revised to meet current compliance requirements. Elon Alas. Abbey Chatters, DO [place] Abbey Chatters, DO 07/20/2020 11:49:43 AM This report has been signed electronically. Number of Addenda: 0

## 2020-07-20 NOTE — Discharge Instructions (Addendum)
Colonoscopy Discharge Instructions  Read the instructions outlined below and refer to this sheet in the next few weeks. These discharge instructions provide you with general information on caring for yourself after you leave the hospital. Your doctor may also give you specific instructions. While your treatment has been planned according to the most current medical practices available, unavoidable complications occasionally occur.   ACTIVITY  You may resume your regular activity, but move at a slower pace for the next 24 hours.   Take frequent rest periods for the next 24 hours.   Walking will help get rid of the air and reduce the bloated feeling in your belly (abdomen).   No driving for 24 hours (because of the medicine (anesthesia) used during the test).    Do not sign any important legal documents or operate any machinery for 24 hours (because of the anesthesia used during the test).  NUTRITION  Drink plenty of fluids.   You may resume your normal diet as instructed by your doctor.   Begin with a light meal and progress to your normal diet. Heavy or fried foods are harder to digest and may make you feel sick to your stomach (nauseated).   Avoid alcoholic beverages for 24 hours or as instructed.  MEDICATIONS  You may resume your normal medications unless your doctor tells you otherwise.  WHAT YOU CAN EXPECT TODAY  Some feelings of bloating in the abdomen.   Passage of more gas than usual.   Spotting of blood in your stool or on the toilet paper.  IF YOU HAD POLYPS REMOVED DURING THE COLONOSCOPY:  No aspirin products for 7 days or as instructed.   No alcohol for 7 days or as instructed.   Eat a soft diet for the next 24 hours.  FINDING OUT THE RESULTS OF YOUR TEST Not all test results are available during your visit. If your test results are not back during the visit, make an appointment with your caregiver to find out the results. Do not assume everything is normal if  you have not heard from your caregiver or the medical facility. It is important for you to follow up on all of your test results.  SEEK IMMEDIATE MEDICAL ATTENTION IF:  You have more than a spotting of blood in your stool.   Your belly is swollen (abdominal distention).   You are nauseated or vomiting.   You have a temperature over 101.   You have abdominal pain or discomfort that is severe or gets worse throughout the day.   Your colonoscopy revealed 1 polyp(s) which I removed successfully. Await pathology results, my office will contact you. I recommend repeating colonoscopy in 7-10 years for surveillance purposes depending on pathology. Otherwise follow up with GI as needed.  I hope you have a great rest of your week!  Charles K. Carver, D.O. Gastroenterology and Hepatology [facility]   Colon Polyps  Colon polyps are tissue growths inside the colon, which is part of the large intestine. They are one of the types of polyps that can grow in the body. A polyp may be a round bump or a mushroom-shaped growth. You could have one polyp or more than one. Most colon polyps are noncancerous (benign). However, some colon polyps can become cancerous over time. Finding and removing the polyps early can help prevent this. What are the causes? The exact cause of colon polyps is not known. What increases the risk? The following factors may make you more likely   to develop this condition:  Having a family history of colorectal cancer or colon polyps.  Being older than 51 years of age.  Being younger than 51 years of age and having a significant family history of colorectal cancer or colon polyps or a genetic condition that puts you at higher risk of getting colon polyps.  Having inflammatory bowel disease, such as ulcerative colitis or Crohn's disease.  Having certain conditions passed from parent to child (hereditary conditions), such as: ? Familial adenomatous  polyposis (FAP). ? Lynch syndrome. ? Turcot syndrome. ? Peutz-Jeghers syndrome. ? MUTYH-associated polyposis (MAP).  Being overweight.  Certain lifestyle factors. These include smoking cigarettes, drinking too much alcohol, not getting enough exercise, and eating a diet that is high in fat and red meat and low in fiber.  Having had childhood cancer that was treated with radiation of the abdomen. What are the signs or symptoms? Many times, there are no symptoms. If you have symptoms, they may include:  Blood coming from the rectum during a bowel movement.  Blood in the stool (feces). The blood may be bright red or very dark in color.  Pain in the abdomen.  A change in bowel habits, such as constipation or diarrhea. How is this diagnosed? This condition is diagnosed with a colonoscopy. This is a procedure in which a lighted, flexible scope is inserted into the opening between the buttocks (anus) and then passed into the colon to examine the area. Polyps are sometimes found when a colonoscopy is done as part of routine cancer screening tests. How is this treated? This condition is treated by removing any polyps that are found. Most polyps can be removed during a colonoscopy. Those polyps will then be tested for cancer. Additional treatment may be needed depending on the results of testing. Follow these instructions at home: Eating and drinking  Eat foods that are high in fiber, such as fruits, vegetables, and whole grains.  Eat foods that are high in calcium and vitamin D, such as milk, cheese, yogurt, eggs, liver, fish, and broccoli.  Limit foods that are high in fat, such as fried foods and desserts.  Limit the amount of red meat, precooked or cured meat, or other processed meat that you eat, such as hot dogs, sausages, bacon, or meat loaves.  Limit sugary drinks.   Lifestyle  Maintain a healthy weight, or lose weight if recommended by your health care provider.  Exercise  every day or as told by your health care provider.  Do not use any products that contain nicotine or tobacco, such as cigarettes, e-cigarettes, and chewing tobacco. If you need help quitting, ask your health care provider.  Do not drink alcohol if: ? Your health care provider tells you not to drink. ? You are pregnant, may be pregnant, or are planning to become pregnant.  If you drink alcohol: ? Limit how much you use to:  0-1 drink a day for women.  0-2 drinks a day for men. ? Know how much alcohol is in your drink. In the [place] one drink equals one 12 oz bottle of beer (355 mL), one 5 oz glass of wine (148 mL), or one 1 oz glass of hard liquor (44 mL). General instructions  Take over-the-counter and prescription medicines only as told by your health care provider.  Keep all follow-up visits. This is important. This includes having regularly scheduled colonoscopies. Talk to your health care provider about when you need a colonoscopy. Contact a health care provider   if:  You have new or worsening bleeding during a bowel movement.  You have new or increased blood in your stool.  You have a change in bowel habits.  You lose weight for no known reason. Summary  Colon polyps are tissue growths inside the colon, which is part of the large intestine. They are one type of polyp that can grow in the body.  Most colon polyps are noncancerous (benign), but some can become cancerous over time.  This condition is diagnosed with a colonoscopy.  This condition is treated by removing any polyps that are found. Most polyps can be removed during a colonoscopy. This information is not intended to replace advice given to you by your health care provider. Make sure you discuss any questions you have with your health care provider. Document Revised: 08/03/2019 Document Reviewed: 08/03/2019 Elsevier Patient Education  2021 Elsevier Inc.   

## 2020-07-20 NOTE — Anesthesia Preprocedure Evaluation (Signed)
Anesthesia Evaluation  Patient identified by MRN, date of birth, ID band Patient awake    Reviewed: Allergy & Precautions, H&P , NPO status , Patient's Chart, lab work & pertinent test results, reviewed documented beta blocker date and time   Airway Mallampati: II  TM Distance: >3 FB Neck ROM: full    Dental no notable dental hx.    Pulmonary neg pulmonary ROS,    Pulmonary exam normal breath sounds clear to auscultation       Cardiovascular Exercise Tolerance: Good hypertension, negative cardio ROS   Rhythm:regular Rate:Normal     Neuro/Psych negative neurological ROS  negative psych ROS   GI/Hepatic negative GI ROS, Neg liver ROS,   Endo/Other  negative endocrine ROS  Renal/GU CRFnegative Renal ROS  negative genitourinary   Musculoskeletal   Abdominal   Peds  Hematology negative hematology ROS (+)   Anesthesia Other Findings   Reproductive/Obstetrics negative OB ROS                             Anesthesia Physical Anesthesia Plan  ASA: II  Anesthesia Plan: General   Post-op Pain Management:    Induction:   PONV Risk Score and Plan: Propofol infusion  Airway Management Planned:   Additional Equipment:   Intra-op Plan:   Post-operative Plan:   Informed Consent: I have reviewed the patients History and Physical, chart, labs and discussed the procedure including the risks, benefits and alternatives for the proposed anesthesia with the patient or authorized representative who has indicated his/her understanding and acceptance.     Dental Advisory Given  Plan Discussed with: CRNA  Anesthesia Plan Comments:         Anesthesia Quick Evaluation

## 2020-07-20 NOTE — Anesthesia Postprocedure Evaluation (Signed)
Anesthesia Post Note  Patient: Jacob Powell  Procedure(s) Performed: COLONOSCOPY WITH PROPOFOL (N/A ) POLYPECTOMY  Patient location during evaluation: Phase II Anesthesia Type: General Level of consciousness: awake and alert Pain management: satisfactory to patient Vital Signs Assessment: post-procedure vital signs reviewed and stable Respiratory status: spontaneous breathing and respiratory function stable Cardiovascular status: blood pressure returned to baseline and stable Postop Assessment: no apparent nausea or vomiting Anesthetic complications: no   No complications documented.   Last Vitals:  Vitals:   07/20/20 1109  BP: 127/79  Pulse: 64  Resp: 14  Temp: 36.7 C  SpO2: 99%    Last Pain:  Vitals:   07/20/20 1130  TempSrc:   PainSc: 0-No pain                 Lorin Glass

## 2020-07-20 NOTE — Transfer of Care (Signed)
Immediate Anesthesia Transfer of Care Note  Patient: Jacob Powell  Procedure(s) Performed: COLONOSCOPY WITH PROPOFOL (N/A ) POLYPECTOMY  Patient Location: PACU  Anesthesia Type:General  Level of Consciousness: awake, alert  and oriented  Airway & Oxygen Therapy: Patient Spontanous Breathing  Post-op Assessment: Report given to RN and Post -op Vital signs reviewed and stable  Post vital signs: Reviewed and stable  Last Vitals:  Vitals Value Taken Time  BP    Temp    Pulse    Resp    SpO2      Last Pain:  Vitals:   07/20/20 1130  TempSrc:   PainSc: 0-No pain      Patients Stated Pain Goal: 7 (07/20/20 1109)  Complications: No complications documented.

## 2020-07-20 NOTE — H&P (Signed)
Primary Care Physician:  Benita Stabile, MD Primary Gastroenterologist:  Dr. Marletta Lor  Pre-Procedure History & Physical: HPI:  Jacob Powell is a 50 y.o. male is here for a colonoscopy for colon cancer screening purposes.  Patient notes family history of colon cancer in uncle. No melena or hematochezia.  No abdominal pain or unintentional weight loss.  No change in bowel habits.  Overall feels well from a GI standpoint.  Past Medical History:  Diagnosis Date  . Hypertension 12/28/2006    Past Surgical History:  Procedure Laterality Date  . APPENDECTOMY    . CHOLECYSTECTOMY N/A 01/24/2018   Procedure: LAPAROSCOPIC CHOLECYSTECTOMY WITH POSSIBLE INTRAOPERATIVE CHOLANGIOGRAM;  Surgeon: Harriette Bouillon, MD;  Location: [facility] OR;  Service: General;  Laterality: N/A;  . HERNIA REPAIR      Prior to Admission medications   Medication Sig Start Date End Date Taking? Authorizing Provider  amLODipine (NORVASC) 10 MG tablet Take 10 mg by mouth daily. 10/07/18  Yes [provider]  ibuprofen (ADVIL) 800 MG tablet Take 1 tablet (800 mg total) by mouth every 8 (eight) hours as needed. Patient taking differently: Take 800 mg by mouth every 8 (eight) hours as needed for moderate pain. 06/28/20  Yes Vivi Barrack, DPM  Ibuprofen-diphenhydrAMINE HCl (ADVIL PM) 200-25 MG CAPS Take 1 tablet by mouth at bedtime.   Yes [provider]  losartan-hydrochlorothiazide (HYZAAR) 100-25 MG tablet Take 1 tablet by mouth daily. 03/20/20  Yes [provider]  Melatonin 5 MG CAPS Take 10-15 mg by mouth at bedtime.   Yes [provider]  Multiple Vitamins-Minerals (MENS MULTIVITAMIN PLUS PO) Take 1 tablet by mouth daily.   Yes [provider]  pseudoephedrine (SUDAFED) 30 MG tablet Take 30 mg by mouth every 4 (four) hours as needed for congestion.   Yes [provider]  acetaminophen (TYLENOL) 500 MG tablet Take 1,000 mg by mouth as needed for headache.    [provider]    Allergies as of 05/29/2020  . (No Known Allergies)    Family History  Family history unknown: Yes    Social History   Socioeconomic History  . Marital status: Married    Spouse name: Not on file  . Number of children: Not on file  . Years of education: Not on file  . Highest education level: Not on file  Occupational History  . Not on file  Tobacco Use  . Smoking status: Never Smoker  . Smokeless tobacco: Never Used  Vaping Use  . Vaping Use: Never used  Substance and Sexual Activity  . Alcohol use: Yes    Comment: occasional  . Drug use: Never  . Sexual activity: Yes  Other Topics Concern  . Not on file  Social History Narrative  . Not on file   Social Determinants of Health   Financial Resource Strain: Not on file  Food Insecurity: Not on file  Transportation Needs: Not on file  Physical Activity: Not on file  Stress: Not on file  Social Connections: Not on file  Intimate Partner Violence: Not on file    Review of Systems: See HPI, otherwise negative ROS  Physical Exam: Vital signs in last 24 hours: BP: ()/()  Arterial Line BP: ()/()    General:   Alert,  Well-developed, well-nourished, pleasant and cooperative in NAD Head:  Normocephalic and atraumatic. Eyes:  Sclera clear, no icterus.   Conjunctiva pink. Ears:  Normal auditory acuity. Nose:  No deformity, discharge,  or  lesions. Mouth:  No deformity or lesions, dentition normal. Neck:  Supple; no masses or thyromegaly. Lungs:  Clear throughout to auscultation.   No wheezes, crackles, or rhonchi. No acute distress. Heart:  Regular rate and rhythm; no murmurs, clicks, rubs,  or gallops. Abdomen:  Soft, nontender and nondistended. No masses, hepatosplenomegaly or hernias noted. Normal bowel sounds, without guarding, and without rebound.   Msk:  Symmetrical without gross deformities. Normal posture. Extremities:  Without clubbing or edema. Neurologic:  Alert and  oriented x4;   grossly normal neurologically. Skin:  Intact without significant lesions or rashes. Cervical Nodes:  No significant cervical adenopathy. Psych:  Alert and cooperative. Normal mood and affect.  Impression/Plan: Jacob Powell is here for a colonoscopy to be performed for colon cancer screening purposes.  The risks of the procedure including infection, bleed, or perforation as well as benefits, limitations, alternatives and imponderables have been reviewed with the patient. Questions have been answered. All parties agreeable.

## 2020-07-24 LAB — SURGICAL PATHOLOGY

## 2020-07-27 ENCOUNTER — Encounter (HOSPITAL_COMMUNITY): Payer: Self-pay | Admitting: Internal Medicine

## 2020-08-16 ENCOUNTER — Other Ambulatory Visit: Payer: Self-pay

## 2020-08-16 ENCOUNTER — Ambulatory Visit: Payer: BC Managed Care – PPO | Admitting: Podiatry

## 2020-08-16 DIAGNOSIS — S96912D Strain of unspecified muscle and tendon at ankle and foot level, left foot, subsequent encounter: Secondary | ICD-10-CM

## 2020-08-16 DIAGNOSIS — M722 Plantar fascial fibromatosis: Secondary | ICD-10-CM

## 2020-08-16 DIAGNOSIS — M779 Enthesopathy, unspecified: Secondary | ICD-10-CM | POA: Diagnosis not present

## 2020-08-16 NOTE — Patient Instructions (Signed)
I have ordered an MRI of the left ankle for [facility]. If you do not hear for them about scheduling within the next 1 week, or you have any questions please give [place] a call at [phone].

## 2020-08-16 NOTE — Progress Notes (Signed)
Subjective: 50 year old male presents the office today for follow-up evaluation of left heel pain, ankle discomfort, swelling.  He states that overall he has improved but he is still having swelling and discomfort.  He states the pain is intermittent.  The inserts have been helpful but again stopped the discomfort.  He states that working aggravates his symptoms.  As an aside he states that he does have left knee as well as back pain.  He has no rating pain or any other nerve symptoms.  Denies any systemic complaints such as fevers, chills, nausea, vomiting. No acute changes since last appointment, and no other complaints at this time.   Objective: AAO x3, NAD DP/PT pulses palpable bilaterally, CRT less than 3 seconds There is continuation of tenderness on the plantar medial tubercle of the calcaneus and insertion of plantar fascia.  There is no pain about across the calcaneus.  There is also still discomfort on the course of the peroneal tendon most on the peroneus brevis.  The tendon tears are intact.  There is mild edema to the area.  No erythema or warmth.  There is no area of pinpoint tenderness.  Achilles tendon appears to be intact.  Negative Tinel's sign.  No pain with calf compression, swelling, warmth, erythema  Assessment: Left heel pain, plantar fasciitis with peroneal tendinitis, rule out tear  Plan: -All treatment options discussed with the patient including all alternatives, risks, complications.  -At this point given his continuation symptoms and I will order an MRI of the left ankle for surgical planning.  I want him to continue his shoes, inserts but he can also use the ankle brace in the left side. -Will follow back up after the MRI or sooner if any issues are to arise.  No further questions today -Patient encouraged to call the office with any questions, concerns, change in symptoms.   Vivi Barrack DPM

## 2020-08-17 ENCOUNTER — Other Ambulatory Visit: Payer: Self-pay

## 2020-08-30 ENCOUNTER — Ambulatory Visit
Admission: RE | Admit: 2020-08-30 | Discharge: 2020-08-30 | Disposition: A | Discharge status: Home / Self Care | Payer: BC Managed Care – PPO | Source: Ambulatory Visit | Attending: Podiatry | Admitting: Podiatry

## 2020-08-30 ENCOUNTER — Other Ambulatory Visit: Payer: Self-pay

## 2020-08-30 DIAGNOSIS — M722 Plantar fascial fibromatosis: Secondary | ICD-10-CM

## 2020-08-30 DIAGNOSIS — M779 Enthesopathy, unspecified: Secondary | ICD-10-CM

## 2020-08-30 DIAGNOSIS — S96912D Strain of unspecified muscle and tendon at ankle and foot level, left foot, subsequent encounter: Secondary | ICD-10-CM

## 2020-09-05 ENCOUNTER — Telehealth: Payer: Self-pay | Admitting: Podiatry

## 2020-09-05 NOTE — Telephone Encounter (Signed)
-----   Message from Vivi Barrack, DPM sent at 08/31/2020  1:29 PM EDT ----- Attempted to call patient, left VM Sent mychart message with results of the MRI  Marchelle Folks- can you please try to call him to schedule a follow up appointment from the MRI? Thank you.

## 2020-09-05 NOTE — Telephone Encounter (Signed)
-----   Message from Matthew R Wagoner, DPM sent at 08/31/2020  1:29 PM EDT ----- Attempted to call patient, left VM Sent mychart message with results of the MRI  Amanda- can you please try to call him to schedule a follow up appointment from the MRI? Thank you.  

## 2020-09-05 NOTE — Telephone Encounter (Signed)
lvm for patient to call and schedule an appointment with our office 

## 2020-09-05 NOTE — Telephone Encounter (Signed)
lvm for patient to call and schedule mri follow up

## 2020-09-27 ENCOUNTER — Other Ambulatory Visit: Payer: Self-pay

## 2020-09-27 ENCOUNTER — Ambulatory Visit: Payer: BC Managed Care – PPO | Admitting: Podiatry

## 2020-09-27 DIAGNOSIS — M722 Plantar fascial fibromatosis: Secondary | ICD-10-CM | POA: Diagnosis not present

## 2020-09-27 DIAGNOSIS — M779 Enthesopathy, unspecified: Secondary | ICD-10-CM | POA: Diagnosis not present

## 2020-09-27 DIAGNOSIS — S96912D Strain of unspecified muscle and tendon at ankle and foot level, left foot, subsequent encounter: Secondary | ICD-10-CM | POA: Diagnosis not present

## 2020-10-03 NOTE — Progress Notes (Signed)
Subjective: 50 year old male presents the office for follow-up evaluation of left heel pain, tendinitis as well as discussed MRI results.  He states that he still having some discomfort but the pain is intermittent.  Still get some swelling.  No other concerns today. Denies any systemic complaints such as fevers, chills, nausea, vomiting. No acute changes since last appointment, and no other complaints at this time.   Objective: AAO x3, NAD DP/PT pulses palpable bilaterally, CRT less than 3 seconds Mild tenderness palpation on plantar medial tubercle of calcaneus and insertion of plantar fashion.  The plantar fashion appears to be intact.  There is discomfort in the course of the peroneal tendon inferior to lateral malleoluswith there is localized edema.  Overall the tendon appears to be intact there is no erythema or warmth.  Achilles tendon intact without any discomfort.  No other areas of discomfort identified. No pain with calf compression, swelling, warmth, erythema  Assessment: Plantar fasciitis with peroneal tendon split tear  Plan: -All treatment options discussed with the patient including all alternatives, risks, complications.  -I reviewed the MRI with him.  We discussed with conservative as well as surgical treatment options.  We decided to continue conservative treatment.  Recommend immobilization for now as he can get the tear to heal on its own and help improve the swelling.  If this improves then we can refer him to physical therapy.  If there is no improvement consider surgical intervention which we discussed today we discussed the surgical's postoperative course but he is not able to do much time off of work currently. -Patient encouraged to call the office with any questions, concerns, change in symptoms.   Vivi Barrack DPM

## 2020-10-05 ENCOUNTER — Telehealth: Payer: Self-pay | Admitting: Podiatry

## 2020-10-05 NOTE — Telephone Encounter (Signed)
Pt would like to know if the item that was supposed to cover his toes while wearing the boot has arrived. Please advise.

## 2020-10-10 NOTE — Telephone Encounter (Signed)
I have toe toe cover and will bring it next week to [place]. Misty Stanley

## 2020-10-16 ENCOUNTER — Other Ambulatory Visit: Payer: Self-pay

## 2020-10-16 ENCOUNTER — Ambulatory Visit: Payer: BC Managed Care – PPO | Admitting: Podiatry

## 2020-10-16 ENCOUNTER — Encounter: Payer: Self-pay | Admitting: Podiatry

## 2020-10-16 DIAGNOSIS — M722 Plantar fascial fibromatosis: Secondary | ICD-10-CM | POA: Diagnosis not present

## 2020-10-16 DIAGNOSIS — S96912D Strain of unspecified muscle and tendon at ankle and foot level, left foot, subsequent encounter: Secondary | ICD-10-CM

## 2020-10-24 NOTE — Progress Notes (Signed)
Subjective: 50 year old male presents the office for follow-up evaluation of left heel pain, partial tear of the peroneal tendon.  Has been wearing the boot which is good stability does help.  Not able to wear this at work that he is picking up a toe guard today.  Overall symptoms are about the same.  Objective: AAO x3, NAD DP/PT pulses palpable bilaterally, CRT less than 3 seconds Mild tenderness palpation on plantar medial tubercle of calcaneus and insertion of plantar fascial.  The plantar fascia appears to be intact.  There is continued discomfort in the course of the peroneal tendon inferior to lateral malleolus habitus.  The edema has improved some.  Overall the tendon appears to be intact.   Achilles tendon intact without any discomfort.  No other areas of discomfort identified. No pain with calf compression, swelling, warmth, erythema  Assessment: Plantar fasciitis with peroneal tendon split tear  Plan: -All treatment options discussed with the patient including all alternatives, risks, complications.  -Again discussed with conservative as well as surgical options.  He cannot perform surgery at this time.  He is continuing cam boot for now and also ongoing referral to physical therapy.  As he starts to feel better he can wean off the brace/boot and wear shoes with good arch support however if he continues to have pain or swelling that would recommend to remain immobilized.  Vivi Barrack DPM

## 2020-11-29 ENCOUNTER — Ambulatory Visit: Payer: BC Managed Care – PPO | Admitting: Podiatry

## 2020-12-06 ENCOUNTER — Other Ambulatory Visit: Payer: Self-pay

## 2020-12-06 ENCOUNTER — Ambulatory Visit: Payer: BC Managed Care – PPO | Admitting: Podiatry

## 2020-12-06 DIAGNOSIS — M722 Plantar fascial fibromatosis: Secondary | ICD-10-CM

## 2020-12-06 DIAGNOSIS — S96912D Strain of unspecified muscle and tendon at ankle and foot level, left foot, subsequent encounter: Secondary | ICD-10-CM | POA: Diagnosis not present

## 2020-12-11 NOTE — Progress Notes (Signed)
Subjective: 50 year old male presents the office for follow-up evaluation of left heel pain, partial tear of the peroneal tendon.  He states that he wears the brace with it.  Feels better when he is not wearing his symptoms are about the same he has not seen much improvement overall.  No recent injury or changes otherwise.  Objective: AAO x3, NAD DP/PT pulses palpable bilaterally, CRT less than 3 seconds There is still mild tenderness palpation on plantar medial aspect of calcaneus insertion of plantar fascial.  Majority discomfort still course the peroneal tendon just posterior and inferior to lateral malleolus.  No edema.  There is no erythema or warmth.  Clinically the tendon appears to be intact.  No areas of pinpoint tenderness.  No other areas of discomfort. No pain with calf compression, swelling, warmth, erythema  Assessment: Plantar fasciitis with peroneal tendon split tear  Plan: -All treatment options discussed with the patient including all alternatives, risks, complications.  -At this point discussed with conservative as well as surgical treatment options.  After discussion he went unlikely to proceed with surgical intervention.  Discussed with him surgery including EPF, possible heel spur resection as well as peroneal tendon repair.  We discussed that surgery is all postoperative course.  He will consider his options on this no he wants to proceed with this.  Vivi Barrack DPM  -Again discussed with conservative as well as surgical options.  He cannot perform surgery at this time.  He is continuing cam boot for now and also ongoing referral to physical therapy.  As he starts to feel better he can wean off the brace/boot and wear shoes with good arch support however if he continues to have pain or swelling that would recommend to remain immobilized.  Vivi Barrack DPM

## 2021-01-31 ENCOUNTER — Other Ambulatory Visit: Payer: Self-pay

## 2021-01-31 ENCOUNTER — Ambulatory Visit: Payer: BC Managed Care – PPO | Admitting: Podiatry

## 2021-01-31 ENCOUNTER — Ambulatory Visit (INDEPENDENT_AMBULATORY_CARE_PROVIDER_SITE_OTHER): Payer: BC Managed Care – PPO

## 2021-01-31 ENCOUNTER — Encounter: Payer: Self-pay | Admitting: Podiatry

## 2021-01-31 DIAGNOSIS — M722 Plantar fascial fibromatosis: Secondary | ICD-10-CM | POA: Diagnosis not present

## 2021-01-31 DIAGNOSIS — S96912D Strain of unspecified muscle and tendon at ankle and foot level, left foot, subsequent encounter: Secondary | ICD-10-CM | POA: Diagnosis not present

## 2021-01-31 DIAGNOSIS — M216X2 Other acquired deformities of left foot: Secondary | ICD-10-CM | POA: Diagnosis not present

## 2021-01-31 NOTE — Patient Instructions (Signed)

## 2021-02-06 NOTE — Progress Notes (Signed)
Subjective: 50 year old male presents the office today for follow evaluation of left heel pain, with tear of the peroneal tendon.  The majority discomfort to the outside aspect ankle and is get some discomfort the bottom of the heel.  He states he is attempted numerous conservative treatments including offloading, physical therapy, shoe modification without significant improvement at this point was to proceed with surgical intervention.  Objective: AAO x3, NAD DP/PT pulses palpable bilaterally, CRT less than 3 seconds There is continuation of tenderness palpation on the course of the peroneal tendon just posterior and inferior to the lateral malleolus and there is localized edema present to the area without any erythema or warmth.  There is mild discomfort mostly on the plantar lateral portion of the calcaneus today and no significant pain on the plantar medial aspect.  Equinus is also present.  No area of pinpoint tenderness. No pain with calf compression, swelling, warmth, erythema  Assessment: Peroneal tendon tear, Plantar fasciitis with equinus  Plan: -All treatment options discussed with the patient including all alternatives, risks, complications.  -We discussed both conservative as well as surgical treatment options.  He wants to proceed with surgery.  I discussed with him repair of the peroneal tendon, PRP injection for the plantar fascia as well as gastrocnemius recession.  As he is not having significant discomfort to the plantar medial aspect of the calcaneus I would recommend holding off on a EPF or release of the plantar fascia and also previous MRI was showing the plantar fascia intact. -The incision placement as well as the postoperative course was discussed with the patient. I discussed risks of the surgery which include, but not limited to, infection, bleeding, pain, swelling, need for further surgery, delayed or nonhealing, painful or ugly scar, numbness or sensation changes,  over/under correction, recurrence, transfer lesions, further deformity, DVT/PE, loss of toe/foot. Patient understands these risks and wishes to proceed with surgery. The surgical consent was reviewed with the patient all 3 pages were signed. No promises or guarantees were given to the outcome of the procedure. All questions were answered to the best of my ability. Before the surgery the patient was encouraged to call the office if there is any further questions. The surgery will be performed at the [facility] on an outpatient basis.  Vivi Barrack DPM

## 2021-02-11 DIAGNOSIS — M79676 Pain in unspecified toe(s): Secondary | ICD-10-CM

## 2021-02-15 ENCOUNTER — Telehealth: Payer: Self-pay | Admitting: Urology

## 2021-02-15 NOTE — Telephone Encounter (Signed)
DOS - 03/06/21  REPAIR TENDON LEFT --- 65035 GASTROCNEMIUS RECESS LEFT --- 46568  [employer] EFFECTIVE DATE - 06/27/19  PLAN DEDUCTIBLE - $750.00 W/ $301.00 REMIANING OUT OF POCKET - $3,500.00 W/ $2,086.00 REMAINING COINSURANCE - 30% COPAY - $0.00   SPOKE WITH RESHAWNNA C. WITH [employer] AND SHE STATED THAT FOR CPT CODE 12751 AND 70017 NO PRIOR AUTH IS REQUIRED.  REF # RESHAWNNA C. 02/15/21

## 2021-03-06 ENCOUNTER — Telehealth: Payer: Self-pay | Admitting: *Deleted

## 2021-03-06 ENCOUNTER — Telehealth: Payer: Self-pay | Admitting: Podiatry

## 2021-03-06 ENCOUNTER — Encounter: Payer: Self-pay | Admitting: Podiatry

## 2021-03-06 ENCOUNTER — Other Ambulatory Visit: Payer: Self-pay | Admitting: Podiatry

## 2021-03-06 DIAGNOSIS — S96912D Strain of unspecified muscle and tendon at ankle and foot level, left foot, subsequent encounter: Secondary | ICD-10-CM | POA: Diagnosis not present

## 2021-03-06 DIAGNOSIS — M216X2 Other acquired deformities of left foot: Secondary | ICD-10-CM | POA: Diagnosis not present

## 2021-03-06 MED ORDER — OXYCODONE-ACETAMINOPHEN 5-325 MG PO TABS: 1.0000 | ORAL_TABLET | Freq: Four times a day (QID) | ORAL | 0 refills | Status: DC | PRN

## 2021-03-06 MED ORDER — PROMETHAZINE HCL 25 MG PO TABS: 25.0000 mg | ORAL_TABLET | Freq: Three times a day (TID) | ORAL | 0 refills | Status: DC | PRN

## 2021-03-06 MED ORDER — CEPHALEXIN 500 MG PO CAPS: 500.0000 mg | ORAL_CAPSULE | Freq: Three times a day (TID) | ORAL | 0 refills | Status: DC

## 2021-03-06 NOTE — Progress Notes (Signed)
Postop medications sent 

## 2021-03-06 NOTE — Telephone Encounter (Signed)
[facility] pharmacy is calling for permission to add on to the end of directions, no more that 6 tablets daily so that she may complete the prescription for patient and he can his pain medicine.Please advise.

## 2021-03-06 NOTE — Telephone Encounter (Signed)
Patient wife called and stated the [name] will not fill [name] pain medication due to the wording on the prescription. She think the dosage might be too high. Please call the pharmacy asap.

## 2021-03-11 ENCOUNTER — Ambulatory Visit (INDEPENDENT_AMBULATORY_CARE_PROVIDER_SITE_OTHER): Payer: BC Managed Care – PPO | Admitting: Podiatry

## 2021-03-11 ENCOUNTER — Other Ambulatory Visit: Payer: Self-pay

## 2021-03-11 DIAGNOSIS — M216X2 Other acquired deformities of left foot: Secondary | ICD-10-CM | POA: Diagnosis not present

## 2021-03-11 DIAGNOSIS — M722 Plantar fascial fibromatosis: Secondary | ICD-10-CM

## 2021-03-11 DIAGNOSIS — S96912D Strain of unspecified muscle and tendon at ankle and foot level, left foot, subsequent encounter: Secondary | ICD-10-CM

## 2021-03-11 DIAGNOSIS — Z9889 Other specified postprocedural states: Secondary | ICD-10-CM

## 2021-03-11 MED ORDER — IBUPROFEN 800 MG PO TABS: 800.0000 mg | ORAL_TABLET | Freq: Three times a day (TID) | ORAL | 0 refills | Status: DC | PRN

## 2021-03-11 MED ORDER — OXYCODONE-ACETAMINOPHEN 5-325 MG PO TABS: 1.0000 | ORAL_TABLET | Freq: Four times a day (QID) | ORAL | 0 refills | Status: DC | PRN

## 2021-03-13 NOTE — Progress Notes (Signed)
Subjective: Jacob Powell is a 50 y.o. is seen today in office s/p left peroneal tendon repair, gastrocnemius recession, PRP injection preformed on 03/06/2021.  States that he is doing well.  His pain is Controlled with the pain medication.  Is been nonweightbearing.  Denies any systemic complaints such as fevers, chills, nausea, vomiting. No calf pain, chest pain, shortness of breath.   Objective: General: No acute distress, AAOx3  DP/PT pulses palpable 2/4, CRT < 3 sec to all digits.  Protective sensation intact. Motor function intact.  Left foot: Incision is well coapted without any evidence of dehiscence with sutures, staples intact.  There is no surrounding erythema, ascending cellulitis, fluctuance, crepitus, malodor, drainage/purulence. There is mild edema around the surgical site. There is mild pain along the surgical site.  No other areas of tenderness to bilateral lower extremities.  No other open lesions or pre-ulcerative lesions.  No pain with calf compression, swelling, warmth, erythema.   Assessment and Plan:  Status post left foot surgery, doing well with no complications   -Treatment options discussed including all alternatives, risks, and complications -Dressing was cleaned today.  A small amount of antibiotic ointment was applied followed by dressing.  A well-padded below-knee fiberglass cast was applied making sure to pad all bony prominences. -Nonweightbearing -Ice/elevation -Pain medication as needed. -Monitor for any clinical signs or symptoms of infection and DVT/PE and directed to call the office immediately should any occur or go to the ER. -Follow-up as scheduled for cast change, suture removal or sooner if any problems arise. In the meantime, encouraged to call the office with any questions, concerns, change in symptoms.   Ovid Curd, DPM

## 2021-03-19 ENCOUNTER — Other Ambulatory Visit: Payer: Self-pay

## 2021-03-19 ENCOUNTER — Encounter: Payer: Self-pay | Admitting: Podiatry

## 2021-03-19 ENCOUNTER — Ambulatory Visit (INDEPENDENT_AMBULATORY_CARE_PROVIDER_SITE_OTHER): Payer: BC Managed Care – PPO | Admitting: Podiatry

## 2021-03-19 ENCOUNTER — Encounter: Payer: BC Managed Care – PPO | Admitting: Podiatry

## 2021-03-19 VITALS — Temp 97.9°F

## 2021-03-19 DIAGNOSIS — M216X2 Other acquired deformities of left foot: Secondary | ICD-10-CM | POA: Diagnosis not present

## 2021-03-19 DIAGNOSIS — Z9889 Other specified postprocedural states: Secondary | ICD-10-CM

## 2021-03-19 DIAGNOSIS — M722 Plantar fascial fibromatosis: Secondary | ICD-10-CM

## 2021-03-26 NOTE — Progress Notes (Signed)
Subjective: Jacob Powell is a 50 y.o. is seen today in office s/p left peroneal tendon repair, gastrocnemius recession, PRP injection preformed on 03/06/2021.  States he has been doing well and his pain is controlled.  Denies any recent injury or falls.  No fevers or chills.  No other concerns.   Objective: General: No acute distress, AAOx3  DP/PT pulses palpable 2/4, CRT < 3 sec to all digits.  Protective sensation intact. Motor function intact.  Left foot: Cast is clean, dry, intact.  Incision is well coapted without any evidence of dehiscence with sutures, staples intact.  There is no surrounding erythema, ascending cellulitis, fluctuance, crepitus, malodor, drainage/purulence. There is mild but improved edema around the surgical site. There is mild pain along the surgical site.  No other areas of discomfort. No other areas of tenderness to bilateral lower extremities.  No other open lesions or pre-ulcerative lesions.  No pain with calf compression, swelling, warmth, erythema.   Assessment and Plan:  Status post left foot surgery, doing well with no complications   -Treatment options discussed including all alternatives, risks, and complications -Cast was removed.  Sutures removed out of the staples intact.  Small amount of antibiotic ointment was applied followed by a bandage.  A well-padded below-knee fiberglass cast was applied making sure to pad all bony prominences. -Nonweightbearing -Ice/elevation -Pain medication as needed. -Monitor for any clinical signs or symptoms of infection and DVT/PE and directed to call the office immediately should any occur or go to the ER. -Follow-up as scheduled for cast change, staple removal or sooner if any problems arise. In the meantime, encouraged to call the office with any questions, concerns, change in symptoms.   Ovid Curd, DPM

## 2021-03-29 ENCOUNTER — Other Ambulatory Visit: Payer: Self-pay

## 2021-03-29 ENCOUNTER — Ambulatory Visit (INDEPENDENT_AMBULATORY_CARE_PROVIDER_SITE_OTHER): Payer: BC Managed Care – PPO | Admitting: Podiatry

## 2021-03-29 DIAGNOSIS — M722 Plantar fascial fibromatosis: Secondary | ICD-10-CM

## 2021-03-29 DIAGNOSIS — Z9889 Other specified postprocedural states: Secondary | ICD-10-CM

## 2021-03-29 DIAGNOSIS — M216X2 Other acquired deformities of left foot: Secondary | ICD-10-CM

## 2021-04-01 NOTE — Progress Notes (Signed)
Subjective: Jacob Powell is a 50 y.o. is seen today in office s/p left peroneal tendon repair, gastrocnemius recession, PRP injection preformed on 03/06/2021.  He presents today for cast change as well as staple removal.  States he been doing well.  He has some discomfort still but overall improving.  Denies any fevers or chills.  No calf pain, chest pain, shortness of breath.   Objective: General: No acute distress, AAOx3  DP/PT pulses palpable 2/4, CRT < 3 sec to all digits.  Protective sensation intact. Motor function intact.  Left foot: Cast is clean, dry, intact.  Incision is well coapted without any evidence of dehiscence with staples intact.  There is no surrounding erythema, ascending cellulitis, fluctuance, crepitus, malodor, drainage/purulence.  There is decreased edema.  There is no streaking erythema, ascending tenderness.  No drainage or pus identified today.  No obvious signs of infection.  Mild tenderness palpation surrounding surgical site. No other open lesions or pre-ulcerative lesions.  No pain with calf compression, swelling, warmth, erythema.   Assessment and Plan:  Status post left foot surgery, doing well with no complications   -Treatment options discussed including all alternatives, risks, and complications -Cast was removed.  Staples removed.  Incision was also cleaned and a small amount of antibiotic ointment was applied followed by dressing.  He was transitioned to a cam boot today.  Continue to ice and elevate and discussed wearing the cam boot at all times.  He can start to wash the foot with soap and water and dry thoroughly apply a similar bandage. -Nonweightbearing -Ice/elevation -Pain medication as needed. -Monitor for any clinical signs or symptoms of infection and DVT/PE and directed to call the office immediately should any occur or go to the ER. -Follow-up as scheduled or sooner if any problems arise. In the meantime, encouraged to call the office with any  questions, concerns, change in symptoms.   Ovid Curd, DPM

## 2021-04-02 ENCOUNTER — Encounter: Payer: BC Managed Care – PPO | Admitting: Podiatry

## 2021-04-12 ENCOUNTER — Ambulatory Visit (INDEPENDENT_AMBULATORY_CARE_PROVIDER_SITE_OTHER): Payer: BC Managed Care – PPO | Admitting: Podiatry

## 2021-04-12 ENCOUNTER — Other Ambulatory Visit: Payer: Self-pay

## 2021-04-12 DIAGNOSIS — Z9889 Other specified postprocedural states: Secondary | ICD-10-CM

## 2021-04-12 DIAGNOSIS — M216X2 Other acquired deformities of left foot: Secondary | ICD-10-CM

## 2021-04-12 DIAGNOSIS — M722 Plantar fascial fibromatosis: Secondary | ICD-10-CM

## 2021-04-15 NOTE — Progress Notes (Signed)
Subjective: Jacob Powell is a 50 y.o. is seen today in office s/p left peroneal tendon repair, gastrocnemius recession, PRP injection preformed on 03/06/2021.  States he has been doing well.  Symptoms are better than others.  At max his pain level is 4-5/10.  No recent injury or falls.  No fevers or chills.  No coughing, chest pain or shortness of breath.  No other concerns.   Objective: General: No acute distress, AAOx3  DP/PT pulses palpable 2/4, CRT < 3 sec to all digits.  Protective sensation intact. Motor function intact.  Left foot: Incisions healing well and scar is formed.  There is slight edema.  There is no erythema or warmth.  There is no opening the incision or signs of dehiscence or infection.  Mild tenderness palpation.  No other areas of discomfort. No other open lesions or pre-ulcerative lesions.  No pain with calf compression, swelling, warmth, erythema.   Assessment and Plan:  Status post left foot surgery, doing well with no complications   -Treatment options discussed including all alternatives, risks, and complications -At this point we discussed range of motion exercises to get started transition to partial weightbearing as tolerated.  We discussed physical therapy as well today.  Continue to ice and elevate to help with any postoperative edema. -Monitor for any clinical signs or symptoms of infection and directed to call the office immediately should any occur or go to the ER.  Return in about 3 weeks (around 05/03/2021).  Vivi Barrack DPM

## 2021-05-02 ENCOUNTER — Other Ambulatory Visit: Payer: Self-pay

## 2021-05-02 ENCOUNTER — Ambulatory Visit (INDEPENDENT_AMBULATORY_CARE_PROVIDER_SITE_OTHER): Payer: BC Managed Care – PPO

## 2021-05-02 ENCOUNTER — Ambulatory Visit (INDEPENDENT_AMBULATORY_CARE_PROVIDER_SITE_OTHER): Payer: BC Managed Care – PPO | Admitting: Podiatry

## 2021-05-02 DIAGNOSIS — Z9889 Other specified postprocedural states: Secondary | ICD-10-CM | POA: Diagnosis not present

## 2021-05-02 DIAGNOSIS — M216X2 Other acquired deformities of left foot: Secondary | ICD-10-CM

## 2021-05-02 DIAGNOSIS — M722 Plantar fascial fibromatosis: Secondary | ICD-10-CM

## 2021-05-06 NOTE — Progress Notes (Signed)
Subjective: MATTHIEU LOFTUS is a 51 y.o. is seen today in office s/p left peroneal tendon repair, gastrocnemius recession, PRP injection preformed on 03/06/2021.  States his pain is controlled but still having some swelling.  He has not yet started physical therapy.  Denies any fevers or chills.  No other concerns today.  Objective: General: No acute distress, AAOx3  DP/PT pulses palpable 2/4, CRT < 3 sec to all digits.  Protective sensation intact. Motor function intact.  Left foot: Incisions healing well and scar is formed.  There is still some edema present.  There is no erythema or warmth.  Mild tenderness palpation about the pain appears to be improved.  No new areas of discomfort.  There is no pain on plantarflexion today.  No pain Achilles tendon. No other open lesions or pre-ulcerative lesions.  No pain with calf compression, swelling, warmth, erythema.   Assessment and Plan:  Status post left foot surgery, doing well with no complications   -Treatment options discussed including all alternatives, risks, and complications -X-rays obtained reviewed.  No evidence of acute fracture. -This point recommend starting physical therapy.  We will [facility] to the [place] location. -Continue with compression, elevation.  Can transition to partial weightbearing as tolerated when she starts physical therapy. -Monitor for any clinical signs or symptoms of infection and directed to call the office immediately should any occur or go to the ER.  Return in about 4 weeks (around 05/30/2021).  Vivi Barrack DPM

## 2021-05-30 ENCOUNTER — Other Ambulatory Visit: Payer: Self-pay

## 2021-05-30 ENCOUNTER — Ambulatory Visit (INDEPENDENT_AMBULATORY_CARE_PROVIDER_SITE_OTHER): Payer: BC Managed Care – PPO | Admitting: Podiatry

## 2021-05-30 DIAGNOSIS — M216X2 Other acquired deformities of left foot: Secondary | ICD-10-CM

## 2021-05-30 DIAGNOSIS — M722 Plantar fascial fibromatosis: Secondary | ICD-10-CM

## 2021-05-30 DIAGNOSIS — S96912D Strain of unspecified muscle and tendon at ankle and foot level, left foot, subsequent encounter: Secondary | ICD-10-CM

## 2021-05-30 DIAGNOSIS — Z9889 Other specified postprocedural states: Secondary | ICD-10-CM

## 2021-06-02 NOTE — Progress Notes (Signed)
Subjective: Jacob Powell is a 51 y.o. is seen today in office s/p left peroneal tendon repair, gastrocnemius recession, PRP injection preformed on 03/06/2021.  He is recent start physical therapy but he is still wearing the cam boot he does get swelling and it hurts when walking even in the cam boot.  No recent injury or falls otherwise.  No other concerns today.  Objective: General: No acute distress, AAOx3  DP/PT pulses palpable 2/4, CRT < 3 sec to all digits.  Protective sensation intact. Motor function intact.  Left foot: Incisions healing well and scar is formed.  There is still some edema present to the lateral aspect ankle on the course of peroneal tendons without discomfort.  No discomfort on plantar aspect the calcaneus.  No area pinpoint tenderness.  No pain with Achilles tendon. No other open lesions or pre-ulcerative lesions.  No pain with calf compression, swelling, warmth, erythema.   Assessment and Plan:  Status post left foot surgery  -Treatment options discussed including all alternatives, risks, and complications -At this point is continue with physical therapy.  He is still in the cam boot have swelling and discomfort with walking.  He is not able to put full weight on his foot yet as well.  Due to this point continue physical therapy and he is not able to return to work at this point.   -Positive progress with physical therapy to try to transition to regular shoe as tolerated.  Discussed shoes with good arch support if needed he can also try ankle brace with a regular shoe for short amount of time as needed as well.  Vivi Barrack DPM

## 2021-06-25 ENCOUNTER — Emergency Department (HOSPITAL_COMMUNITY)
Admission: EM | Admit: 2021-06-25 | Discharge: 2021-06-25 | Disposition: A | Discharge status: Home / Self Care | Payer: BC Managed Care – PPO | Attending: Emergency Medicine | Admitting: Emergency Medicine

## 2021-06-25 ENCOUNTER — Ambulatory Visit: Payer: BC Managed Care – PPO | Admitting: Podiatry

## 2021-06-25 ENCOUNTER — Other Ambulatory Visit: Payer: Self-pay

## 2021-06-25 ENCOUNTER — Encounter (HOSPITAL_COMMUNITY): Payer: Self-pay

## 2021-06-25 ENCOUNTER — Emergency Department (HOSPITAL_COMMUNITY): Payer: BC Managed Care – PPO

## 2021-06-25 DIAGNOSIS — R110 Nausea: Secondary | ICD-10-CM | POA: Insufficient documentation

## 2021-06-25 DIAGNOSIS — Z5321 Procedure and treatment not carried out due to patient leaving prior to being seen by health care provider: Secondary | ICD-10-CM | POA: Insufficient documentation

## 2021-06-25 DIAGNOSIS — M722 Plantar fascial fibromatosis: Secondary | ICD-10-CM

## 2021-06-25 DIAGNOSIS — R519 Headache, unspecified: Secondary | ICD-10-CM | POA: Diagnosis not present

## 2021-06-25 DIAGNOSIS — R072 Precordial pain: Secondary | ICD-10-CM | POA: Diagnosis not present

## 2021-06-25 DIAGNOSIS — Z9889 Other specified postprocedural states: Secondary | ICD-10-CM

## 2021-06-25 DIAGNOSIS — S96912D Strain of unspecified muscle and tendon at ankle and foot level, left foot, subsequent encounter: Secondary | ICD-10-CM

## 2021-06-25 LAB — CBC
HCT: 48.9 % (ref 39.0–52.0)
Hemoglobin: 16.7 g/dL (ref 13.0–17.0)
MCH: 28.9 pg (ref 26.0–34.0)
MCHC: 34.2 g/dL (ref 30.0–36.0)
MCV: 84.7 fL (ref 80.0–100.0)
Platelets: 256 10*3/uL (ref 150–400)
RBC: 5.77 MIL/uL (ref 4.22–5.81)
RDW: 13.2 % (ref 11.5–15.5)
WBC: 7.8 10*3/uL (ref 4.0–10.5)
nRBC: 0 % (ref 0.0–0.2)

## 2021-06-25 LAB — BASIC METABOLIC PANEL
Anion gap: 9 (ref 5–15)
BUN: 14 mg/dL (ref 6–20)
CO2: 28 mmol/L (ref 22–32)
Calcium: 9 mg/dL (ref 8.9–10.3)
Chloride: 102 mmol/L (ref 98–111)
Creatinine, Ser: 1.15 mg/dL (ref 0.61–1.24)
GFR, Estimated: >60 mL/min (ref >60)
Glucose, Bld: 117 mg/dL — ABNORMAL HIGH (ref 70–99)
Potassium: 3.6 mmol/L (ref 3.5–5.1)
Sodium: 139 mmol/L (ref 135–145)

## 2021-06-25 LAB — TROPONIN I (HIGH SENSITIVITY): Troponin I (High Sensitivity): 4 ng/L (ref <18)

## 2021-06-25 NOTE — ED Triage Notes (Signed)
Pt arrived via POV for c/o 6/10 mid sternal chest pain that radiates into esophagusx53mins. Pt states felt nauseous earlier and has HA, but denies dizziness and SOB.

## 2021-06-25 NOTE — ED Provider Triage Note (Signed)
Emergency Medicine Provider Triage Evaluation Note  SOL ENGLERT , a 51 y.o. male  was evaluated in triage.  Pt complains of substernal chest pain that radiates up into the throat.  Patient has a history of GERD however this feels different to him.  Reports pain feeling like tightness.  Currently improving.  No other associated symptoms.  Review of Systems  Positive:  Negative: See above   Physical Exam  BP (!) 155/106 (BP Location: Right Arm)    Pulse 65    Temp 98.9 F (37.2 C) (Oral)    Resp 16    Ht 5\' 10"  (1.778 m)    Wt 102.1 kg    SpO2 99%    BMI 32.30 kg/m  Gen:   Awake, no distress   Resp:  Normal effort  MSK:   Moves extremities without difficulty  Other:    Medical Decision Making  Medically screening exam initiated at 5:49 PM.  Appropriate orders placed.  DERRYCK SHAHAN was informed that the remainder of the evaluation will be completed by another provider, this initial triage assessment does not replace that evaluation, and the importance of remaining in the ED until their evaluation is complete.     Zenaida Deed [place], [place] 06/25/21 1750

## 2021-06-25 NOTE — ED Notes (Signed)
Pt states that he is leaving because he feels better

## 2021-06-26 ENCOUNTER — Encounter: Payer: Self-pay | Admitting: Podiatry

## 2021-06-26 NOTE — Progress Notes (Signed)
Subjective: Jacob Powell is a 51 y.o. is seen today in office s/p left peroneal tendon repair, gastrocnemius recession, PRP injection preformed on 03/06/2021.  States he is doing the same compared to last appointment he still been discomfort and rates his pain level 4/10.  Still doing physical therapy.  He states his foot still hurts when he stands or does a lot of walking and due to this he is not yet ready to return to work.  Objective: General: No acute distress, AAOx3  DP/PT pulses palpable 2/4, CRT < 3 sec to all digits.  Protective sensation intact. Motor function intact.  Left foot: Incisions healing well and scar is formed.  There is still some mild edema present.  There is tenderness palpation the course of the peroneal tendon distally as well as the plantar central aspect of the calcaneus.  No pain with lateral compression of calcaneus.  No erythema or signs of infection. No pain with calf compression, swelling, warmth, erythema.   Assessment and Plan:  Status post left foot surgery  -Treatment options discussed including all alternatives, risks, and complications -Continue with physical therapy.  Continue ice and elevate as well as discussed shoes and good arch support.  Unfortunately still not able to stand for long peers of time he is not yet ready to return to work.  Due to similar symptoms out of work until July 29, 2021.  Vivi Barrack DPM

## 2021-06-28 ENCOUNTER — Ambulatory Visit (INDEPENDENT_AMBULATORY_CARE_PROVIDER_SITE_OTHER): Payer: BC Managed Care – PPO

## 2021-06-28 ENCOUNTER — Other Ambulatory Visit: Payer: Self-pay

## 2021-06-28 DIAGNOSIS — M722 Plantar fascial fibromatosis: Secondary | ICD-10-CM

## 2021-06-28 NOTE — Patient Instructions (Signed)

## 2021-06-28 NOTE — Progress Notes (Signed)
Patient presents for the 1st EPAT treatment today with complaint of left heel pain. Diagnosed with plantar fasciitis by Dr. Ardelle Anton. This has been ongoing for several months. The patient has tried ice, stretching, NSAIDS and supportive shoe gear with no long term relief.  ? ?Most of the pain is located posterior heel . ? ?ESWT administered and tolerated well.Treatment settings initiated at: ? ? Energy: 15 ? ?Ended treatment session today with 3000 shocks at the following settings: ? ? Energy: 15 ? Frequency: 6.0 ? Joules: 14.72 ? ? ?Reviewed post EPAT instructions. Advised to avoid ice and NSAIDs throughout the treatment process and to utilize boot or supportive shoes for at least the next 3 days. ? ?Follow up for 2nd treatment in 1 week.  ?

## 2021-07-12 ENCOUNTER — Other Ambulatory Visit: Payer: BC Managed Care – PPO

## 2021-07-23 ENCOUNTER — Other Ambulatory Visit: Payer: Self-pay

## 2021-07-23 ENCOUNTER — Ambulatory Visit: Payer: BC Managed Care – PPO | Admitting: Podiatry

## 2021-07-23 DIAGNOSIS — S96912D Strain of unspecified muscle and tendon at ankle and foot level, left foot, subsequent encounter: Secondary | ICD-10-CM

## 2021-07-23 DIAGNOSIS — M722 Plantar fascial fibromatosis: Secondary | ICD-10-CM

## 2021-07-23 NOTE — Progress Notes (Signed)
Subjective: ?Jacob Powell is a 51 y.o. is seen today in office s/p left peroneal tendon repair, gastrocnemius recession, PRP injection preformed on 03/06/2021.  States he still having pain on the course of the peroneal tendon that he points to as well as the heel pain he is on his foot for long  He has discomfort to his left second, third, fourth toes.  This is new since the surgery as well.  He is back to regular shoe.  He was doing physical therapy but not.  He is making good progress.  He did have 1 treatment of the  EPAT but was not able to complete this as the machine broke.  He has 2 more physical therapy sessions.  He is scheduled to return to work on July 29, 2021 but he feels that he is not ready to return to work doing his normal job activities.  No recent injuries.  No fevers or chills.  No other concerns.   ? ?Objective: ?General: No acute distress, AAOx3  ?DP/PT pulses palpable 2/4, CRT < 3 sec to all digits.  ?Protective sensation intact. Motor function intact.  ?Left foot: Incisions healing well and scar is formed.  There is mild tenderness palpation of the course the peroneal tendon posterior inferior to the lateral malleolus there is discomfort towards the fifth metatarsal base.  There is still some edema present there is no erythema or warmth.  Subjectively feeling tenderness on the plantar lateral aspect calcaneus and insertion of plantar fascia.  There is no pain with lateral compression of calcaneus.  No significant pain to the lesser digits today. ?No pain with calf compression, swelling, warmth, erythema.  ? ?Assessment and Plan:  ?Status post left foot surgery ? ?-Treatment options discussed including all alternatives, risks, and complications ?-At this point recommend compression socks.  Prescription was written for compression socks for [facility], 20-30 mmHg knee high.  ?-We will [facility] in [facility] building and I like to try dry needling as well ?-Discussed  changing shoes as his shoes are about a year and a half old.  He did get some [employer]  series which are more comfortable. ?-Continue home rehab exercises. ?-He is not able to return to work at this point on April 3 as scheduled.  We will extend him out of work until Sep 09, 2021. ? ?Return in about 4 weeks (around 08/20/2021). ? ?Vivi Barrack DPM ?

## 2021-08-09 ENCOUNTER — Telehealth: Payer: Self-pay

## 2021-08-09 NOTE — Telephone Encounter (Signed)
Lmom to call back to schedule EPAT

## 2021-08-14 ENCOUNTER — Ambulatory Visit (INDEPENDENT_AMBULATORY_CARE_PROVIDER_SITE_OTHER): Payer: BC Managed Care – PPO

## 2021-08-14 DIAGNOSIS — M722 Plantar fascial fibromatosis: Secondary | ICD-10-CM

## 2021-08-14 NOTE — Progress Notes (Signed)
Patient presents for the 2nd EPAT treatment today with complaint of left heel pain. Diagnosed with plantar fasciitis by Dr. Ardelle Anton. This has been ongoing for several months. The patient has tried ice, stretching, NSAIDS and supportive shoe gear with no long term relief.  ? ?Most of the pain is located posterior heel . ? ?ESWT administered and tolerated well.Treatment settings initiated at: ? ? Energy: 20 ? ?Ended treatment session today with 3000 shocks at the following settings: ? ? Energy: 20 ? Frequency: 5.0 ? Joules: 19.62 ? ? ?Reviewed post EPAT instructions. Advised to avoid ice and NSAIDs throughout the treatment process and to utilize boot or supportive shoes for at least the next 3 days. ? ?Follow up for 3rd treatment in 1 week.  ?

## 2021-08-20 ENCOUNTER — Ambulatory Visit: Payer: BC Managed Care – PPO | Admitting: Podiatry

## 2021-08-20 DIAGNOSIS — M722 Plantar fascial fibromatosis: Secondary | ICD-10-CM | POA: Diagnosis not present

## 2021-08-20 DIAGNOSIS — S96912D Strain of unspecified muscle and tendon at ankle and foot level, left foot, subsequent encounter: Secondary | ICD-10-CM

## 2021-08-27 NOTE — Progress Notes (Signed)
Subjective: ?Jacob Powell is a 51 y.o. is seen today in office s/p left peroneal tendon repair, gastrocnemius recession, PRP injection preformed on 03/06/2021.  States he is doing about the same, to last appointment.  The dry needling was somewhat helpful but unfortunately no longer has any therapy coverage for insurance.  He is unable to stand for long periods of time although he is back into a regular shoe.  Still gets swelling to the ankle.  He has completed 1 EPAT treatment.  No recent injury or change otherwise.  No fevers or chills.  ? ?Objective: ?General: No acute distress, AAOx3  ?DP/PT pulses palpable 2/4, CRT < 3 sec to all digits.  ?Protective sensation intact. Motor function intact.  ?Left foot: Incisions healing well and scar is formed.  There is still mild edema to the lateral aspect ankle tenderness palpation directly along the peroneal tendon just posterior to lateral malleolus.  There is also mild discomfort on the plantar aspect of calcaneus.  No edema present to the plantar heel or on the plantar fascia.  No other areas of discomfort noted today.  MMT 5/5 ?No pain with calf compression, swelling, warmth, erythema.  ? ?Assessment and Plan:  ?Status post left foot surgery ? ?-Treatment options discussed including all alternatives, risks, and complications ?-Continue compression socks. ?-He has finished the course of physical therapy.  He is also done 1 EPAT treatment.  Or any continue this treatment for now. ?-Continue supportive shoe gear. ?-Continue home rehab exercises. ?-He is unable to return to work is not able to stand for long period of time.  Was standing out of work until July 1. ? ?Trula Slade DPM ?

## 2021-08-30 ENCOUNTER — Ambulatory Visit (INDEPENDENT_AMBULATORY_CARE_PROVIDER_SITE_OTHER): Payer: Self-pay

## 2021-08-30 ENCOUNTER — Other Ambulatory Visit: Payer: BC Managed Care – PPO

## 2021-08-30 DIAGNOSIS — M722 Plantar fascial fibromatosis: Secondary | ICD-10-CM

## 2021-08-30 NOTE — Progress Notes (Signed)
Patient presents for the 3rd EPAT treatment today with complaint of left heel pain. Diagnosed with plantar fasciitis by Dr. Ardelle Anton. This has been ongoing for several months. The patient has tried ice, stretching, NSAIDS and supportive shoe gear with no long term relief.  ? ?Most of the pain is located posterior heel . ? ?ESWT administered and tolerated well.Treatment settings initiated at: ? ? Energy: 25 ? ?Ended treatment session today with 3000 shocks at the following settings: ? ? Energy: 25 ? Frequency: 4.0 ? Joules: 24.52 ? ? ?Reviewed post EPAT instructions. Advised to avoid ice and NSAIDs throughout the treatment process and to utilize boot or supportive shoes for at least the next 3 days. ? ?Follow up for 4th treatment in 2 week.  ?

## 2021-09-03 ENCOUNTER — Ambulatory Visit: Payer: BC Managed Care – PPO

## 2021-09-03 DIAGNOSIS — M79672 Pain in left foot: Secondary | ICD-10-CM

## 2021-09-03 NOTE — Progress Notes (Signed)
SITUATION ?Reason for Consult: Follow-up with foot orthotics ?Patient / Caregiver Report: Patient has pain in left heel at location of bone spur ? ?OBJECTIVE DATA ?History / Diagnosis:  ?  ICD-10-CM   ?1. Pain of left heel  M79.672   ?  ? ? ?Change in Pathology: None ? ?ACTIONS PERFORMED ?Patient's equipment was checked for structural stability and fit. Devices returned to [facility] for central heel relief to be added.All questions answered and concerns addressed. ? ?PLAN ?Patient to return in six weeks or sooner for re-fitting. Plan of care discussed with and agreed upon by patient / caregiver. ? ?

## 2021-09-10 ENCOUNTER — Other Ambulatory Visit: Payer: Self-pay

## 2021-09-17 ENCOUNTER — Ambulatory Visit: Payer: BC Managed Care – PPO | Admitting: Podiatry

## 2021-09-17 DIAGNOSIS — M722 Plantar fascial fibromatosis: Secondary | ICD-10-CM

## 2021-09-17 MED ORDER — TRIAMCINOLONE ACETONIDE 10 MG/ML IJ SUSP
10.0000 mg | Freq: Once | INTRAMUSCULAR | Status: AC
  Administered 2021-09-17: 10 mg

## 2021-09-17 NOTE — Patient Instructions (Signed)

## 2021-09-25 NOTE — Progress Notes (Signed)
Subjective: Jacob Powell is a 51 y.o. is seen today in office s/p left peroneal tendon repair, gastrocnemius recession, PRP injection preformed on 03/06/2021.  States he still gets some discomfort along the peroneal tendon and also to the bottom of his heel particular general walking or standing.  Unfortunatly is been laid off from work.  He is not able to complete the EPAT treatments as it has been repaired.  No recent injury or changes otherwise.  Objective: General: No acute distress, AAOx3  DP/PT pulses palpable 2/4, CRT < 3 sec to all digits.  Protective sensation intact. Motor function intact.  Left foot: Incisions healing well and scar is formed.  Some mild tenderness along the peroneal tendon but discomfort he is describing more on the plantar aspect the heel still.  Plantar fascia appears intact.  Mostly on the plantar aspect the heel both medial lateral aspects.  There is no pain with lateral compression of calcaneus.  No edema no erythema at this area.  Negative Tinel sign.  MMT 5/5 No pain with calf compression, swelling, warmth, erythema.   Assessment and Plan:  Left heel pain plantar fasciitis with peroneal tendinitis  -Treatment options discussed including all alternatives, risks, and complications -Steroid injection performed of the heel today.  Skin was cleaned with alcohol mixture 1 cc Kenalog 10, 0.5 cc Marcaine plain, 0.5 cc of lidocaine plain was infiltrated along the plantar aspect calcaneus and I did have to the medial approach and half of a lateral approach.  Postinjection care discussed.  Tolerated well. -Hopefully we can get the EPAT treatment started again soon. -He has exhausted his physical therapy treatment. -I do think part of the peroneal tendon was given his significant tearing present.  If symptoms continue we can repeat MRI to evaluate the healing. -Discussed as he is looking for new job trying to get a job where he can sit some would be helpful. -Continue  compression socks. -Continue home rehab exercises. -Continue shoes with good arch support. -He is unable to return to work is not able to stand for long period of time.  We will extend him out of work until August 1 to see is not able to return to work as he cannot stand for long period of time.  Trula Slade DPM

## 2021-10-15 ENCOUNTER — Ambulatory Visit: Payer: BC Managed Care – PPO

## 2021-10-15 DIAGNOSIS — M722 Plantar fascial fibromatosis: Secondary | ICD-10-CM

## 2021-10-15 NOTE — Progress Notes (Signed)
SITUATION Reason for Consult: Follow-up with foot orthotics Patient / Caregiver Report: Redelivered modified FOs  OBJECTIVE DATA History / Diagnosis:    ICD-10-CM   1. Plantar fasciitis, left  M72.2       Change in Pathology: None  ACTIONS PERFORMED Patient's equipment was checked for structural stability and fit. Patient is satisfied. Device(s) intact and fit is excellent. All questions answered and concerns addressed.  PLAN Follow-up as needed (PRN). Plan of care discussed with and agreed upon by patient / caregiver.

## 2021-11-14 ENCOUNTER — Ambulatory Visit (INDEPENDENT_AMBULATORY_CARE_PROVIDER_SITE_OTHER): Payer: BC Managed Care – PPO | Admitting: Podiatry

## 2021-11-14 DIAGNOSIS — M722 Plantar fascial fibromatosis: Secondary | ICD-10-CM | POA: Diagnosis not present

## 2021-11-14 DIAGNOSIS — S96912D Strain of unspecified muscle and tendon at ankle and foot level, left foot, subsequent encounter: Secondary | ICD-10-CM | POA: Diagnosis not present

## 2021-11-14 MED ORDER — CELECOXIB 100 MG PO CAPS: 100.0000 mg | ORAL_CAPSULE | Freq: Two times a day (BID) | ORAL | 0 refills | Status: DC

## 2021-11-14 NOTE — Progress Notes (Signed)
Subjective:  Chief Complaint  Patient presents with   Routine Post Op    // DOS 03/06/2021 LT FOOT REPAIR OF PEARNEAL TENDON, GASTROCNEMIUS RECESS (HEEL CORD LENTHENING) PRP INJECTION, pt is over all doing well, pt is still having some swelling, and some heel pain, rate pain of 5 out of 10, Injections have  helped      51 year old male presents the office with above complaint.  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 He states walking around is not as bad. Overall he thinks it is doing somewhat better.  However although he is doing better he is unable to stand on his feet for several hours at a time.  He states there are some days that it hurts more than others. It depends how much he is on his feet.    Objective: General: No acute distress, AAOx3  DP/PT pulses palpable 2/4, CRT < 3 sec to all digits.  Protective sensation intact. Motor function intact.  Left foot: Incisions healing well and scar is formed.  Some minimal tenderness along the peroneal tendon but discomfort he is describing more on the plantar aspect the heel still.  Plantar fascia appears intact.  Mostly on the plantar aspect the heel both medial lateral aspects.  There is no pain with lateral compression of calcaneus.  No edema no erythema at this area.  Negative Tinel sign.  MMT 5/5 No pain with calf compression, swelling, warmth, erythema.   Assessment and Plan:  Left heel pain plantar fasciitis with peroneal tendinitis  -Treatment options discussed including all alternatives, risks, and complications -Discussed repeat steroid injection will be held off on this today.  Continue stretching, rehab exercises well supportive shoe gear.  Gradual increase in nighttime is on his feet.  As he is not able to stand for extended periods of time we will keep him out of work until the full year from surgery on March 06, 2022.  Vivi Barrack DPM

## 2022-01-28 ENCOUNTER — Ambulatory Visit: Payer: BC Managed Care – PPO | Admitting: Podiatry

## 2022-02-11 ENCOUNTER — Ambulatory Visit: Payer: BC Managed Care – PPO | Admitting: Podiatry

## 2022-02-11 DIAGNOSIS — M722 Plantar fascial fibromatosis: Secondary | ICD-10-CM | POA: Diagnosis not present

## 2022-02-11 DIAGNOSIS — S96912D Strain of unspecified muscle and tendon at ankle and foot level, left foot, subsequent encounter: Secondary | ICD-10-CM

## 2022-02-11 DIAGNOSIS — M25472 Effusion, left ankle: Secondary | ICD-10-CM | POA: Diagnosis not present

## 2022-02-11 MED ORDER — IBUPROFEN 800 MG PO TABS: 800.0000 mg | ORAL_TABLET | Freq: Three times a day (TID) | ORAL | 0 refills | Status: DC | PRN

## 2022-02-11 NOTE — Progress Notes (Signed)
Subjective: Chief Complaint  Patient presents with   Plantar Fasciitis    Patient came in today for left foot plantar fasciitis, patient is still having swelling and pain, on the lateral side of the ankle and foot, rate of pain 7 out of 10 while working and after, throbbing ache, hurt when moving foot side to side, TX: inserts, pain meds    51 year old male presents the above concerns.  States he still been doing the heel as well as left side particularly if he were to turn his foot outwards.  He has returned to work he is sitting most of the day which has been beneficial more than being on his feet all day.  No recent injury or changes otherwise.    Objective: AAO x3, NAD DP/PT pulses palpable bilaterally, CRT less than 3 seconds Mild tenderness to palpation the course the peroneal tendons with localized edema there is no erythema or warmth.  Clinically the tendon appears to be intact.  There is mild tenderness palpation on the plantar aspect calcaneal insertion of plantar fascia.  No pain with lateral compression of calcaneus.  No pain with calf compression, swelling, warmth, erythema  Assessment: Chronic heel pain, plantar fasciitis, tendinitis  Plan: -All treatment options discussed with the patient including all alternatives, risks, complications.  -I added a lateral post to his orthotic.  Discussed continue anti-inflammatories as needed, stretching, icing a regular basis.  Consider return to physical therapy.  Would also like to continue with the EPAT treatment however this is currently not working and is out for repair.  Consider repeat steroid injection as well.  Also discussed repeat MRI.  Decided to hold off on this today. -Compression sock prescription given given some residual swelling along the ankle. -Patient encouraged to call the office with any questions, concerns, change in symptoms.   Trula Slade DPM

## 2022-04-15 ENCOUNTER — Ambulatory Visit: Payer: BC Managed Care – PPO | Admitting: Podiatry

## 2022-05-30 ENCOUNTER — Other Ambulatory Visit (HOSPITAL_COMMUNITY): Payer: Self-pay | Admitting: Internal Medicine

## 2022-05-30 DIAGNOSIS — E782 Mixed hyperlipidemia: Secondary | ICD-10-CM

## 2022-07-18 ENCOUNTER — Ambulatory Visit (HOSPITAL_COMMUNITY)
Admission: RE | Admit: 2022-07-18 | Discharge: 2022-07-18 | Disposition: A | Discharge status: Home / Self Care | Payer: Self-pay | Source: Ambulatory Visit | Attending: Internal Medicine | Admitting: Internal Medicine

## 2022-07-18 DIAGNOSIS — E782 Mixed hyperlipidemia: Secondary | ICD-10-CM | POA: Insufficient documentation

## 2022-07-21 IMAGING — CR DG CHEST 2V
2 series · 2 of 2 positions shown · non-contrast
Comparison: October 29, 2018

CLINICAL DATA: [DATE] midsternal chest pain

EXAM:
CHEST - 2 VIEW

[chest pa]
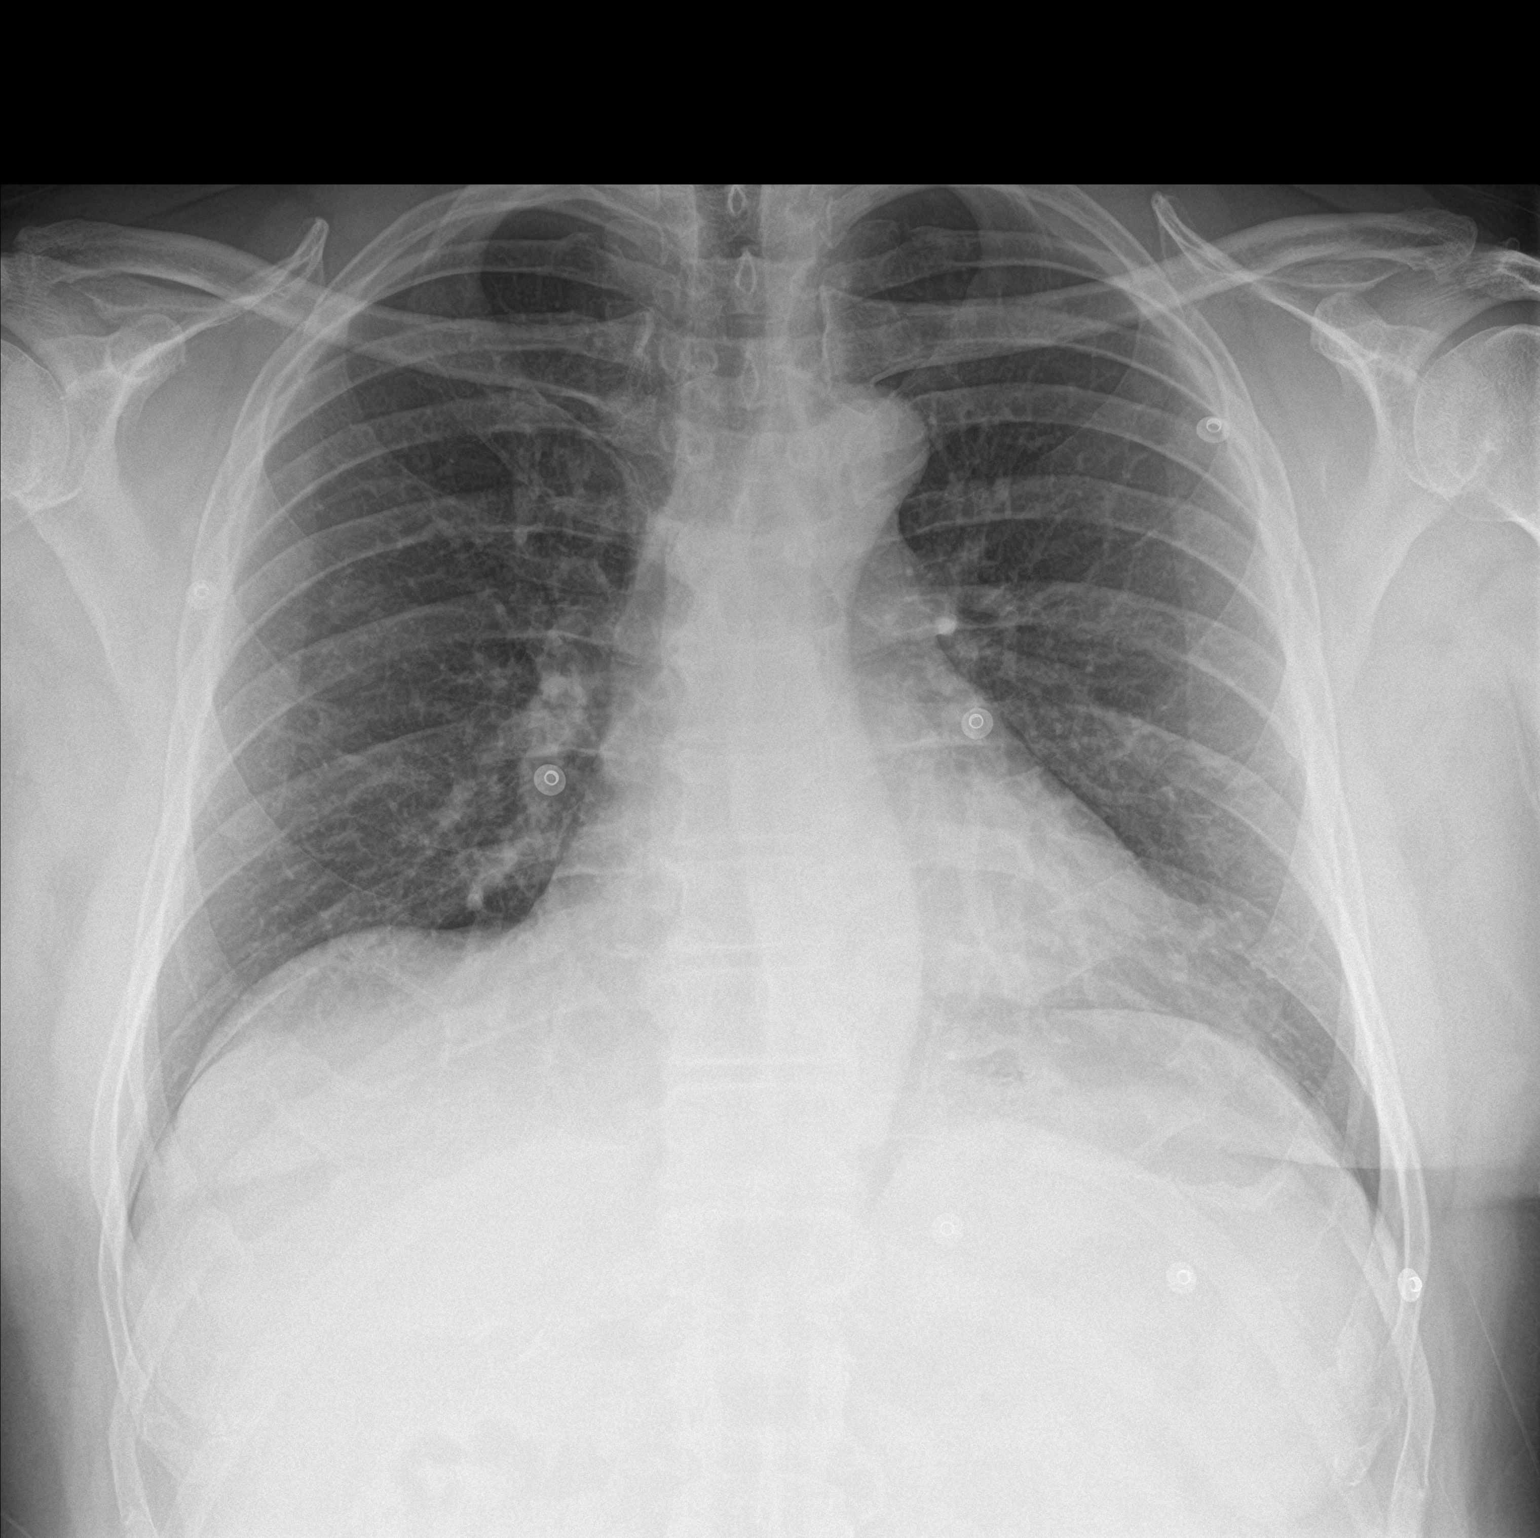

[chest lat]
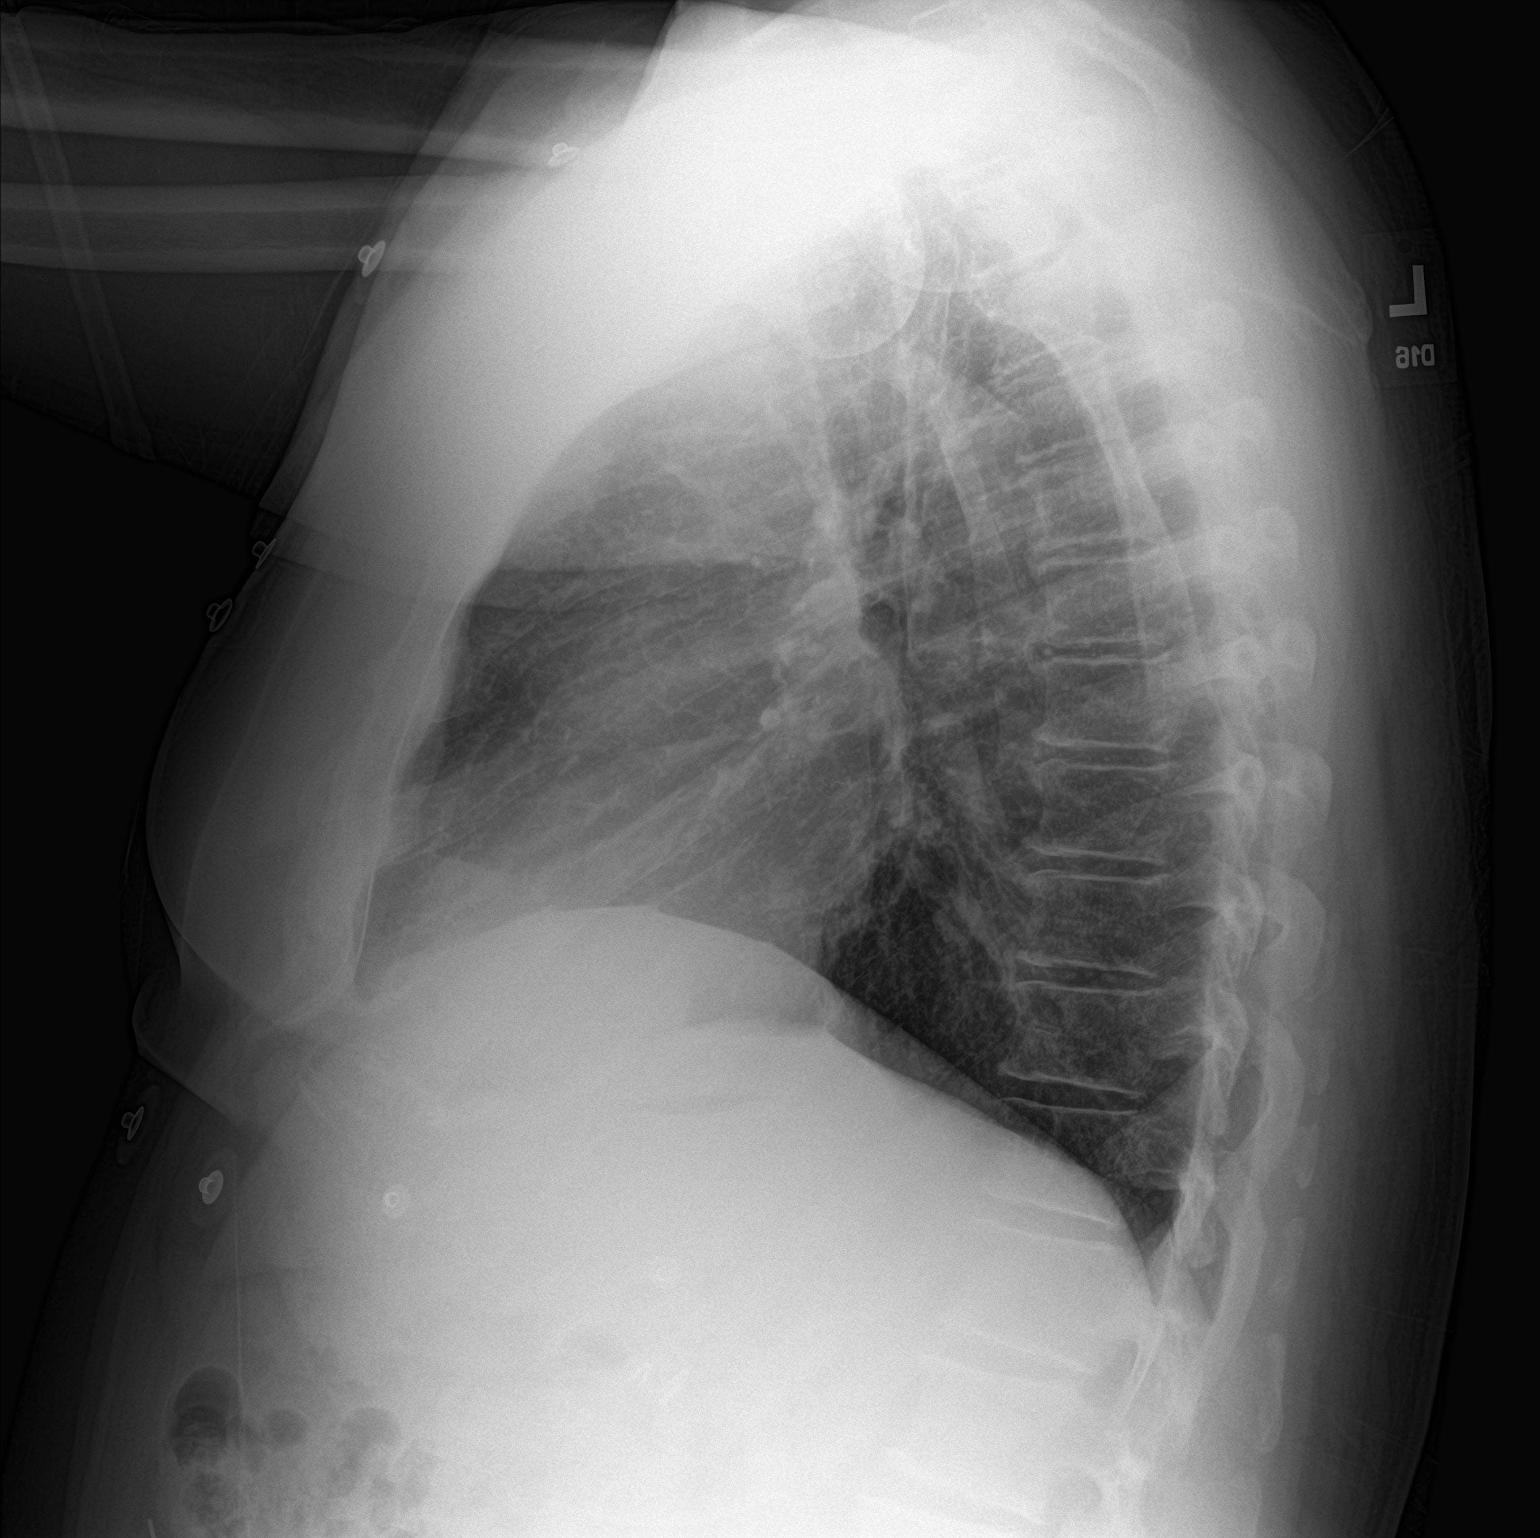

[2 of 2 positions shown; findings below may reference images not displayed]

FINDINGS: The heart size and mediastinal contours are within normal limits.
Tortuous thoracic aorta. No focal airspace consolidation. No pleural
effusion or pneumothorax. Thoracic spondylosis.
IMPRESSION: No acute cardiopulmonary disease.

## 2023-04-15 ENCOUNTER — Other Ambulatory Visit (INDEPENDENT_AMBULATORY_CARE_PROVIDER_SITE_OTHER): Payer: 59

## 2023-04-15 ENCOUNTER — Encounter: Payer: Self-pay | Admitting: Orthopedic Surgery

## 2023-04-15 ENCOUNTER — Ambulatory Visit (INDEPENDENT_AMBULATORY_CARE_PROVIDER_SITE_OTHER): Payer: 59 | Admitting: Orthopedic Surgery

## 2023-04-15 VITALS — BP 136/85 | HR 57 | Ht 70.0 in | Wt 232.0 lb

## 2023-04-15 DIAGNOSIS — M25561 Pain in right knee: Secondary | ICD-10-CM

## 2023-04-15 NOTE — Progress Notes (Signed)
New Patient Visit  Assessment: Jacob Powell is a 52 y.o. male with the following: 1. Acute pain of right knee  Plan: Jacob Powell has pain in the right knee.  Pain is primarily within the medial aspect of the right knee.  He does have a varus alignment overall.  Near complete loss of joint space within the medial compartment.  Minimal osteophytes.  Pain is most likely related to his overall alignment, and degenerative changes within the medial compartment.  I have offered him an injection.  He has elected to proceed.  This was completed in clinic today.  I have also recommended a medial unloader brace, which could potentially help with pain throughout the right knee.  He states understanding.  He will return to clinic as needed.  Procedure note injection Right knee joint   Verbal consent was obtained to inject the right knee joint  Timeout was completed to confirm the site of injection.  The skin was prepped with alcohol and ethyl chloride was sprayed at the injection site.  A 21-gauge needle was used to inject 40 mg of Depo-Medrol and 1% lidocaine (4 cc) into the right knee using an anterolateral approach.  There were no complications. A sterile bandage was applied.   Follow-up: Return if symptoms worsen or fail to improve.  Subjective:  Chief Complaint  Patient presents with   Knee Pain    R knee for 4-5 mos getting worse in the past 2-3 wks.     History of Present Illness: Jacob Powell is a 52 y.o. male who has been referred by Catalina Pizza, MD for evaluation of right knee pain.  Patient states he has had progressively worsening pain in the right knee for several months.  Over the past 3 weeks or so, it has gotten a lot worse.  Pain is primarily in the medial aspect of the right knee.  He has been taking Celebrex for pain.  This has improved some of his symptoms.  Otherwise, he has not tried a brace.  No injections.  He notes that his left knee is starting to bother him, but not as  bad.  He also reports that he is starting to have some pain in his lower back, which she believes is starting because of his right knee pain.   Review of Systems: No fevers or chills No numbness or tingling No chest pain No shortness of breath No bowel or bladder dysfunction No GI distress No headaches   Medical History:  Past Medical History:  Diagnosis Date   History of kidney stones    Hypertension 12/28/2006    Past Surgical History:  Procedure Laterality Date   APPENDECTOMY     CHOLECYSTECTOMY N/A 01/24/2018   Procedure: LAPAROSCOPIC CHOLECYSTECTOMY WITH POSSIBLE INTRAOPERATIVE CHOLANGIOGRAM;  Surgeon: Harriette Bouillon, MD;  Location: [facility] OR;  Service: General;  Laterality: N/A;   COLONOSCOPY WITH PROPOFOL N/A 07/20/2020   Procedure: COLONOSCOPY WITH PROPOFOL;  Surgeon: Lanelle Bal, DO;  Location: [facility];  Service: Endoscopy;  Laterality: N/A;  ASA II/ AM procedure   FOOT TENDON SURGERY Left    FRACTURE SURGERY Right    finger on right hand   HERNIA REPAIR     POLYPECTOMY  07/20/2020   Procedure: POLYPECTOMY;  Surgeon: Lanelle Bal, DO;  Location: [facility];  Service: Endoscopy;;    Family History  Family history unknown: Yes   Social History   Tobacco Use   Smoking status: Never  Smokeless tobacco: Never  Vaping Use   Vaping status: Never Used  Substance Use Topics   Alcohol use: Yes    Comment: occasional   Drug use: Never    No Known Allergies  Current Meds  Medication Sig   acetaminophen (TYLENOL) 500 MG tablet Take 1,000 mg by mouth as needed for headache.   amLODipine (NORVASC) 10 MG tablet Take 10 mg by mouth daily.   celecoxib (CELEBREX) 100 MG capsule Take 1 capsule (100 mg total) by mouth 2 (two) times daily.   Ibuprofen-diphenhydrAMINE HCl (ADVIL PM) 200-25 MG CAPS Take 1 tablet by mouth at bedtime.   losartan-hydrochlorothiazide (HYZAAR) 100-25 MG tablet Take 1 tablet by mouth daily.   Melatonin 5 MG CAPS Take 10-15 mg  by mouth at bedtime.   Multiple Vitamins-Minerals (MENS MULTIVITAMIN PLUS PO) Take 1 tablet by mouth daily.    Objective: BP 136/85   Pulse (!) 57   Ht 5\' 10"  (1.778 m)   Wt 232 lb (105.2 kg)   BMI 33.29 kg/m   Physical Exam:  General: Alert and oriented. and No acute distress. Gait: Right sided antalgic gait.  Evaluation of the right knee demonstrates a mild effusion.  Varus alignment overall.  Tenderness to palpation along the medial joint line.  No increased laxity varus or valgus stress.  Negative Lachman.  No tenderness to palpation along the proximal tibia.  No tenderness to palpation within the medial femoral condyle.  Mild discomfort over the lateral joint line.  Mild tenderness in the posterior aspect of the right knee.  No cyst is palpable, but there may be a small cyst.  Crepitus with range of motion testing.  Range of motion from 0-110 degrees.  IMAGING: I personally ordered and reviewed the following images  X-rays of the right knee were obtained in clinic today.  No acute injuries are noted.  Varus alignment overall.  On the tunnel view, there is near complete loss of joint space within the medial compartment.  Minimal osteophytes are noted.  Patella is seated within the trochlea, with some small osteophytes off both the medial and lateral facets.  Impression: Right knee x-rays with varus alignment, mild to moderate degenerative changes overall.   New Medications:  No orders of the defined types were placed in this encounter.     Oliver Barre, MD  04/15/2023 11:17 AM

## 2023-04-28 ENCOUNTER — Encounter: Payer: Self-pay | Admitting: Orthopedic Surgery

## 2023-04-28 ENCOUNTER — Ambulatory Visit: Payer: 59 | Admitting: Orthopedic Surgery

## 2023-04-28 DIAGNOSIS — M25561 Pain in right knee: Secondary | ICD-10-CM | POA: Diagnosis not present

## 2023-04-28 MED ORDER — TRAMADOL HCL 50 MG PO TABS: 50.0000 mg | ORAL_TABLET | Freq: Four times a day (QID) | ORAL | 0 refills | Status: DC | PRN

## 2023-04-28 MED ORDER — CELECOXIB 100 MG PO CAPS: 100.0000 mg | ORAL_CAPSULE | Freq: Two times a day (BID) | ORAL | 0 refills | Status: DC

## 2023-04-28 NOTE — Progress Notes (Signed)
 New Patient Visit  Assessment: Jacob Powell is a 52 y.o. male with the following: 1. Acute pain of right knee  Plan: ALGIS LEHENBAUER has pain in the right knee.  He does not feel as though the injection helped, although his symptoms seem to be better.  He continues to have pain in the medial aspect of the knee.  He states the brace is helping, but he continues to have pain at the end of the day, and it is affecting his sleep.  Reviewed radiographs again, which demonstrates complete loss of joint space within the medial compartment.  I provided him with a refill of Celebrex , and recommended he take it twice daily.  I have also provided him with tramadol  for breakthrough pain.  Continue to use the brace.  If he continues to have issues, we may have to consider.  If time off of work, or an MRI for further evaluation.  He states understanding.  Follow-up in 1 month.  He will call if he has further issues.    Follow-up: Return in about 4 weeks (around 05/26/2023).  Subjective:  Chief Complaint  Patient presents with   Knee Pain    R knee pain doesn't notice a difference after injection 04/15/23.     History of Present Illness: Jacob Powell is a 52 y.o. male who returns to clinic for evaluation of right knee pain.  I saw him in clinic approximately 2 weeks ago.  At that time, the knee was injected.  He states he does not feel as though it is improved his pain.  However, he seems to be doing better overall.  He finds the brace to be effective.  Swelling of the right knee is improved.  He continues to take Celebrex , but his prescription is ending today.  No additional injuries.  He wants to avoid surgery.  He cannot afford to be out of work currently.  Review of Systems: No fevers or chills No numbness or tingling No chest pain No shortness of breath No bowel or bladder dysfunction No GI distress No headaches    Objective: There were no vitals taken for this visit.  Physical  Exam:  General: Alert and oriented. and No acute distress. Gait: Right sided antalgic gait.  Evaluation of the right knee is without effusion.  Varus alignment overall.  Tenderness to palpation along the medial joint line.  No increased laxity varus or valgus stress.  Negative Lachman.  No tenderness to palpation along the proximal tibia.  No tenderness to palpation within the medial femoral condyle.  Mild discomfort over the lateral joint line.  Mild tenderness in the posterior aspect of the right knee.  Crepitus with range of motion testing.  Range of motion from 0-110 degrees.  IMAGING: No new imaging obtained today   New Medications:  Meds ordered this encounter  Medications   celecoxib  (CELEBREX ) 100 MG capsule    Sig: Take 1 capsule (100 mg total) by mouth 2 (two) times daily.    Dispense:  60 capsule    Refill:  0   traMADol  (ULTRAM ) 50 MG tablet    Sig: Take 1 tablet (50 mg total) by mouth every 6 (six) hours as needed.    Dispense:  20 tablet    Refill:  0      Oneil DELENA Horde, MD  04/28/2023 4:10 PM

## 2023-05-27 ENCOUNTER — Ambulatory Visit: Payer: 59 | Admitting: Orthopedic Surgery

## 2023-05-27 ENCOUNTER — Encounter: Payer: Self-pay | Admitting: Orthopedic Surgery

## 2023-05-27 VITALS — BP 139/84 | HR 78 | Ht 70.0 in | Wt 232.0 lb

## 2023-05-27 DIAGNOSIS — G8929 Other chronic pain: Secondary | ICD-10-CM | POA: Diagnosis not present

## 2023-05-27 DIAGNOSIS — M25561 Pain in right knee: Secondary | ICD-10-CM | POA: Diagnosis not present

## 2023-05-27 DIAGNOSIS — M25361 Other instability, right knee: Secondary | ICD-10-CM

## 2023-05-27 MED ORDER — TRAMADOL HCL 50 MG PO TABS: 50.0000 mg | ORAL_TABLET | Freq: Four times a day (QID) | ORAL | 0 refills | Status: AC | PRN

## 2023-05-27 MED ORDER — CELECOXIB 100 MG PO CAPS: 100.0000 mg | ORAL_CAPSULE | Freq: Two times a day (BID) | ORAL | 0 refills | Status: DC

## 2023-05-27 NOTE — Progress Notes (Signed)
[facility] patient Visit  Assessment: Jacob Powell is a 53 y.o. male with the following: 1. Acute pain of right knee  Plan: Jacob Powell continues to have pain in the right knee.  Pain is primarily within the medial aspect of the knee.  He has pain with hyperflexion.  Positive McMurray's.  He continues to have swelling in the right knee.  He notes occasional catching sensations in the knee.  Injection was not helpful.  Braces provided some support.  He has been able to work, but is struggling overall.  Based on the constellation of symptoms, I am recommending an MRI of the right knee.  Once this is completed, we will see him in clinic to discuss the findings.   Follow-up: Return for after MRI.  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 male who returns to clinic for evaluation of right knee pain.  He continues to have pain in the medial aspect of the right knee.  He has swelling in the right knee.  He is using a brace.  He is able to work, but has modified his activities overall.  He has been taking Celebrex, as well as tramadol.  He notes an aching sensation at rest.   Review of Systems: No fevers or chills No numbness or tingling No chest pain No shortness of breath No bowel or bladder dysfunction No GI distress No headaches    Objective: BP 139/84   Pulse 78   Ht 5\' 10"  (1.778 m)   Wt 232 lb (105.2 kg)   BMI 33.29 kg/m   Physical Exam:  General: Alert and oriented. and No acute distress. Gait: Right sided antalgic gait.  Right knee with a mild effusion.  Varus alignment overall.  He has tenderness to palpation along the medial joint line.  No increased laxity to varus or valgus stress.  Pain with hyperflexion.  Positive McMurray's.  IMAGING: No new imaging obtained today   New Medications:  Meds ordered this encounter   Medications   celecoxib (CELEBREX) 100 MG capsule    Sig: Take 1 capsule (100 mg total) by mouth 2 (two) times daily.    Dispense:  60 capsule    Refill:  0   traMADol (ULTRAM) 50 MG tablet    Sig: Take 1 tablet (50 mg total) by mouth every 6 (six) hours as needed.    Dispense:  20 tablet    Refill:  0      Oliver Barre, MD  05/27/2023 9:52 AM

## 2023-05-27 NOTE — Patient Instructions (Signed)
While we are working on your approval for MRI please go ahead and call to schedule your appointment with [facility] within at least one (1) week.   Central Scheduling [phone]

## 2023-06-03 ENCOUNTER — Ambulatory Visit (HOSPITAL_COMMUNITY)
Admission: RE | Admit: 2023-06-03 | Discharge: 2023-06-03 | Disposition: A | Discharge status: Home / Self Care | Payer: 59 | Source: Ambulatory Visit | Attending: Orthopedic Surgery | Admitting: Orthopedic Surgery

## 2023-06-03 DIAGNOSIS — M25561 Pain in right knee: Secondary | ICD-10-CM | POA: Insufficient documentation

## 2023-06-03 DIAGNOSIS — G8929 Other chronic pain: Secondary | ICD-10-CM | POA: Insufficient documentation

## 2023-06-12 NOTE — Progress Notes (Signed)
Called to have images read for appointment.

## 2023-06-17 ENCOUNTER — Ambulatory Visit: Payer: 59 | Admitting: Orthopedic Surgery

## 2023-06-17 ENCOUNTER — Encounter: Payer: Self-pay | Admitting: Orthopedic Surgery

## 2023-06-17 DIAGNOSIS — M1711 Unilateral primary osteoarthritis, right knee: Secondary | ICD-10-CM

## 2023-06-17 DIAGNOSIS — M7121 Synovial cyst of popliteal space [Baker], right knee: Secondary | ICD-10-CM | POA: Diagnosis not present

## 2023-06-17 DIAGNOSIS — S83231D Complex tear of medial meniscus, current injury, right knee, subsequent encounter: Secondary | ICD-10-CM

## 2023-06-17 NOTE — Progress Notes (Signed)
 Return patient Visit  Assessment: Jacob Powell is a 53 y.o. 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  Pain is primarily within the medial aspect of the knee.  He also has pain in the posterior aspect of the knee.  MRI demonstrates moderate to severe degenerative changes in the right knee, most severe within the medial compartment.  He also has a Baker's cyst.  This is discussed in clinic today.  He is interested in trying hyaluronic acid injections.  In addition, he elected to proceed with an ultrasound-guided aspiration and injection of a Baker's cyst.  This was completed in clinic today.  He will follow-up once we have obtained authorization for hyaluronic acid injections.   Procedure note injection - Right knee (Bakers cyst), ultrasound guidance   Verbal consent was obtained to aspirate the Right knee, Bakers cyst Timeout was completed to confirm the site of aspiration and injection.   Using the ultrasound, the cyst was identified.   The skin was prepped with alcohol and ethyl chloride was sprayed at the injection site.  Under direct visualization, the needle was introduced within the cyst We then aspirated 5 cc of clear joint fluid Using the same needle, 40 mg of Depo-Medrol and 1% lidocaine (1 cc) was injected into the remaining cyst cavity of the posterior Right knee using a posterior approach.   There were no complications.  A sterile bandage was applied.   Note: In order to accurately identify the placement of the needle, ultrasound was required, to increase the accuracy, and specificity of the injection.   Follow-up: Return for After Insurance Authorization for Injection.  Subjective:  Chief Complaint  Patient presents with   Knee Pain    History of Present Illness: Jacob Powell is a 53 y.o. male who returns to clinic for  evaluation of right knee pain.  He continues to have pain in the medial aspect of the right knee.  He also has some pain in the posterior aspect of the knee.  Compared to the first time I saw him, he feels better.  He wears a brace at work, or if he is walking long distances.  He also takes Celebrex.  He has obtained an MRI, and is here discuss the findings.  Review of Systems: No fevers or chills No numbness or tingling No chest pain No shortness of breath No bowel or bladder dysfunction No GI distress No headaches    Objective: There were no vitals taken for this visit.  Physical Exam:  General: Alert and oriented. and No acute distress. Gait: Right sided antalgic gait.  Right knee with a mild effusion.  Varus alignment overall.  He has tenderness to palpation along the medial joint line.  No increased laxity to varus or valgus stress.  Pain with hyperflexion.  There is some swelling within the popliteal space.  Negative Lachman.  IMAGING: I personally reviewed images previously obtained in clinic  Right knee MRI  IMPRESSION: 1. Moderate extrusion of the body of the medial meniscus with complex tear within the posterior segment of the body of the medial meniscus and multiple nondisplaced tears within the posterior horn of the medial meniscus. 2. Moderate to severe medial compartment and moderate patellofemoral compartment cartilage degenerative changes. 3. Borderline mild joint effusion. Small Baker's cyst.   New Medications:  No orders of the defined types  were placed in this encounter.     Oliver Barre, MD  06/17/2023 9:10 AM

## 2023-06-17 NOTE — Patient Instructions (Signed)

## 2023-06-20 ENCOUNTER — Other Ambulatory Visit: Payer: Self-pay | Admitting: Orthopedic Surgery

## 2023-07-09 ENCOUNTER — Telehealth: Payer: Self-pay

## 2023-07-09 NOTE — Telephone Encounter (Signed)
-----   Message from Nurse Alvera Novel D sent at 06/17/2023  9:09 AM EST ----- Regarding: HA Injections Please assist with HA injection authorization.

## 2023-07-09 NOTE — Telephone Encounter (Signed)
 VOB has been submitted for Orthovisc, right knee

## 2023-07-17 ENCOUNTER — Telehealth: Payer: Self-pay

## 2023-07-17 NOTE — Telephone Encounter (Signed)
 Faxed completed PA form to [employer] at [phone] for Orthovisc, right knee. PA pending

## 2023-07-23 ENCOUNTER — Telehealth: Payer: Self-pay

## 2023-07-23 DIAGNOSIS — M1711 Unilateral primary osteoarthritis, right knee: Secondary | ICD-10-CM

## 2023-07-23 NOTE — Telephone Encounter (Signed)
 Please schedule patient for 3 appts.with Dr. Dallas Schimke for gel injection.  All gel information is under the referrals tab.

## 2023-07-29 ENCOUNTER — Encounter: Payer: Self-pay | Admitting: Internal Medicine

## 2023-07-29 ENCOUNTER — Ambulatory Visit: Payer: Self-pay | Attending: Internal Medicine | Admitting: Internal Medicine

## 2023-07-29 VITALS — BP 134/86 | HR 55 | Ht 70.0 in | Wt 263.0 lb

## 2023-07-29 DIAGNOSIS — R931 Abnormal findings on diagnostic imaging of heart and coronary circulation: Secondary | ICD-10-CM | POA: Insufficient documentation

## 2023-07-29 DIAGNOSIS — R079 Chest pain, unspecified: Secondary | ICD-10-CM | POA: Insufficient documentation

## 2023-07-29 DIAGNOSIS — I10 Essential (primary) hypertension: Secondary | ICD-10-CM | POA: Insufficient documentation

## 2023-07-29 MED ORDER — ASPIRIN 81 MG PO TBEC: 81.0000 mg | DELAYED_RELEASE_TABLET | Freq: Every day | ORAL | Status: AC

## 2023-07-29 MED ORDER — NITROGLYCERIN 0.4 MG SL SUBL: 0.4000 mg | SUBLINGUAL_TABLET | SUBLINGUAL | 3 refills | Status: AC | PRN

## 2023-07-29 NOTE — Patient Instructions (Addendum)
 Medication Instructions:  Your physician has recommended you make the following change in your medication:   -Stop Celecoxib -Start Aspirin 81 mg once daily  -Start Nitroglycerin 0.4 mg as needed for chest pain. The proper use and anticipated side effects of nitroglycerine has been carefully explained.  If a single episode of chest pain is not relieved by one tablet, the patient will try another within 5 minutes; and if this doesn't relieve the pain, the patient is instructed to call 911 for transportation to an emergency department.   *If you need a refill on your cardiac medications before your next appointment, please call your pharmacy*  Lab Work: None If you have labs (blood work) drawn today and your tests are completely normal, you will receive your results only by: MyChart Message (if you have MyChart) OR A paper copy in the mail If you have any lab test that is abnormal or we need to change your treatment, we will call you to review the results.  Testing/Procedures: Your physician has requested that you have a lexiscan myoview. For further information please visit [email or web address] Please follow instruction sheet, as given.  Your physician has requested that you have an echocardiogram. Echocardiography is a painless test that uses sound waves to create images of your heart. It provides your doctor with information about the size and shape of your heart and how well your heart's chambers and valves are working. This procedure takes approximately one hour. There are no restrictions for this procedure. Please do NOT wear cologne, perfume, aftershave, or lotions (deodorant is allowed). Please arrive 15 minutes prior to your appointment time.  Please note: We ask at that you not bring children with you during ultrasound (echo/ vascular) testing. Due to room size and safety concerns, children are not allowed in the ultrasound rooms during exams. Our front office staff cannot provide  observation of children in our lobby area while testing is being conducted. An adult accompanying a patient to their appointment will only be allowed in the ultrasound room at the discretion of the ultrasound technician under special circumstances. We apologize for any inconvenience.   Follow-Up: At [facility], you and your health needs are our priority.  As part of our continuing mission to provide you with exceptional heart care, our providers are all part of one team.  This team includes your primary Cardiologist (physician) and Advanced Practice Providers or APPs (Physician Assistants and Nurse Practitioners) who all work together to provide you with the care you need, when you need it.  Your next appointment:   3 month(s)  Provider:   You may see Vishnu P Mallipeddi, MD or one of the following Advanced Practice Providers on your designated Care Team:   [place], PA-C  Scotesia Bolton, [place] Jacolyn Reedy, [place]     We recommend signing up for the patient portal called "MyChart".  Sign up information is provided on this After Visit Summary.  MyChart is used to connect with patients for Virtual Visits (Telemedicine).  Patients are able to view lab/test results, encounter notes, upcoming appointments, etc.  Non-urgent messages can be sent to your provider as well.   To learn more about what you can do with MyChart, go to [email or web address].   Other Instructions

## 2023-07-29 NOTE — Progress Notes (Signed)
 Cardiology Office Note  Date: 07/29/2023   ID: SHAFT CORIGLIANO, DOB 03/26/1971, MRN [id number]  PCP:  Benita Stabile, MD  Cardiologist:  Marjo Bicker, MD Electrophysiologist:  None   History of Present Illness: Jacob Powell is a 53 y.o. male known to have HTN was referred to cardiology clinic for evaluation of chest pain.  Patient has sporadic episodes of substernal chest tightness that occurs with exertion.  Last episode was yesterday but prior to that, he was unable to recall.  Does not have a SL NTG with him.  No DOE, dizziness, syncope, palpitations, leg swelling.  He has right knee osteoarthritis and meniscal tear, has bone-on-bone pain.  Takes steroid shots for that and celecoxib as well.  He underwent coronary calcium scoring test done recently that showed coronary calcium score of 199, 91st percentile for age and sex matched control.  Does not smoke cigarettes.  Past Medical History:  Diagnosis Date   History of kidney stones    Hypertension 12/28/2006    Past Surgical History:  Procedure Laterality Date   APPENDECTOMY     CHOLECYSTECTOMY N/A 01/24/2018   Procedure: LAPAROSCOPIC CHOLECYSTECTOMY WITH POSSIBLE INTRAOPERATIVE CHOLANGIOGRAM;  Surgeon: Harriette Bouillon, MD;  Location: [facility];  Service: General;  Laterality: N/A;   COLONOSCOPY WITH PROPOFOL N/A 07/20/2020   Procedure: COLONOSCOPY WITH PROPOFOL;  Surgeon: Lanelle Bal, DO;  [facility];  Service: Endoscopy;  Laterality: N/A;  ASA II/ AM procedure   FOOT TENDON SURGERY Left    FRACTURE SURGERY Right    finger on right hand   HERNIA REPAIR     POLYPECTOMY  07/20/2020   Procedure: POLYPECTOMY;  Surgeon: Lanelle Bal, DO;  [facility]: [facility];  Service: Endoscopy;;    Current Outpatient Medications  Medication Sig Dispense Refill   acetaminophen (TYLENOL) 500 MG tablet Take 1,000 mg by mouth as needed for headache.     celecoxib (CELEBREX) 100 MG capsule Take 1 capsule by mouth twice  daily 60 capsule 0   levocetirizine (XYZAL) 5 MG tablet Take 5 mg by mouth daily.     Melatonin 5 MG CAPS Take 10-15 mg by mouth at bedtime.     Multiple Vitamins-Minerals (MENS MULTIVITAMIN PLUS PO) Take 1 tablet by mouth daily.     nebivolol (BYSTOLIC) 10 MG tablet Take 10 mg by mouth at bedtime.     olmesartan-hydrochlorothiazide (BENICAR HCT) 40-25 MG tablet Take 1 tablet by mouth daily.     pantoprazole (PROTONIX) 40 MG tablet Take 40 mg by mouth daily.     rosuvastatin (CRESTOR) 10 MG tablet Take 10 mg by mouth daily.     traMADol (ULTRAM) 50 MG tablet Take 1 tablet (50 mg total) by mouth every 6 (six) hours as needed. 20 tablet 0   No current facility-administered medications for this visit.   Allergies:  Patient has no known allergies.   Social History: The patient  reports that he has never smoked. He has never used smokeless tobacco. He reports current alcohol use. He reports that he does not use drugs.   Family History: The patient's family history includes Cancer in his father and mother.   ROS:  Please see the history of present illness. Otherwise, complete review of systems is positive for none.  All other systems are reviewed and negative.   Physical Exam: VS:  BP 134/86 (BP Location: Right Arm, Patient Position: Sitting, Cuff Size: Large)   Pulse (!) 55   Ht  5\' 10"  (1.778 m)   Wt 263 lb (119.3 kg)   SpO2 98%   BMI 37.74 kg/m , BMI Body mass index is 37.74 kg/m.  Wt Readings from Last 3 Encounters:  07/29/23 263 lb (119.3 kg)  05/27/23 232 lb (105.2 kg)  04/15/23 232 lb (105.2 kg)    General: Patient appears comfortable at rest. HEENT: Conjunctiva and lids normal, oropharynx clear with moist mucosa. Neck: Supple, no elevated JVP or carotid bruits, no thyromegaly. Lungs: Clear to auscultation, nonlabored breathing at rest. Cardiac: Regular rate and rhythm, no S3 or significant systolic murmur, no pericardial rub. Abdomen: Soft, nontender, no hepatomegaly, bowel  sounds present, no guarding or rebound. Extremities: No pitting edema, distal pulses 2+. Skin: Warm and dry. Musculoskeletal: No kyphosis. Neuropsychiatric: Alert and oriented x3, affect grossly appropriate.  Recent Labwork: No results found for requested labs within last 365 days.  No results found for: "CHOL", "TRIG", "HDL", "CHOLHDL", "VLDL", "LDLCALC", "LDLDIRECT"   Assessment and Plan:   Cardiac chest pain: Sporadic episodes of substernal exertional chest tightness.  Last for minutes and resolves with rest.  Coronary calcium score is 199 (LAD 196, RCA 3.6).  Due to exertional chest tightness, he will benefit from echocardiogram and Lexiscan.  Start aspirin 81 mg once daily, continue rosuvastatin 10 mg nightly.  Discontinue celecoxib. SL NTG as needed for chest pain.  ER precautions for chest pain provided.  Elevated coronary calcium score 199 (91st percentile for age and sex matched control): LAD 196, RCA 3.60.  Same plan as above.  HTN, poorly controlled: Goal BP less than 130/90 mmHg.  Elevated at home.  Currently on nebivolol 10 mg once daily and olmesartan-HCTZ 40-25 mg once daily.  Strongly encouraged exercise, intermittent fasting etc. to lose weight that will help with HTN control.  He has follow-up appointment with his PCP this afternoon who is working to get his blood pressure under control.   Medication Adjustments/Labs and Tests Ordered: Current medicines are reviewed at length with the patient today.  Concerns regarding medicines are outlined above.    Disposition:  Follow up 3 months  Signed, Francia Verry Verne Spurr, MD, 07/29/2023 9:57 AM    [facility] at [facility] [address] [place], [place] [address]

## 2023-08-19 ENCOUNTER — Ambulatory Visit: Admitting: Orthopedic Surgery

## 2023-08-19 DIAGNOSIS — M1711 Unilateral primary osteoarthritis, right knee: Secondary | ICD-10-CM | POA: Diagnosis not present

## 2023-08-19 MED ORDER — HYALURONAN 30 MG/2ML IX SOSY
30.0000 mg | PREFILLED_SYRINGE | Freq: Once | INTRA_ARTICULAR | Status: AC
  Administered 2023-08-19: 30 mg via INTRA_ARTICULAR

## 2023-08-19 NOTE — Progress Notes (Signed)
 Return patient Visit  Assessment: Jacob Powell is a 53 y.o. male with the following: Right knee pain; moderate to severe arthritis with degenerative tearing of the medial meniscus  Plan: [name] continues to have pain in the right knee.  He has degenerative changes in the right knee.  He has previously tried steroid injections.  He is interested in hyaluronic acid injections.  First injection was set up today.  I will see him in 1 week for second injection.  This patient is diagnosed with osteoarthritis of the knee(s).    Radiographs show evidence of joint space narrowing, osteophytes, subchondral sclerosis and/or subchondral cysts.  This patient has knee pain which interferes with functional and activities of daily living.    This patient has experienced inadequate response, adverse effects and/or intolerance with conservative treatments such as acetaminophen , NSAIDS, topical creams, physical therapy or regular exercise, knee bracing and/or weight loss.   This patient has experienced inadequate response or has a contraindication to intra articular steroid injections for at least 3 months.   This patient is not scheduled to have a total knee replacement within 6 months of starting treatment with viscosupplementation.   Procedure note injection Right knee joint   Verbal consent was obtained to inject the right knee joint  Timeout was completed to confirm the site of injection.  The skin was prepped with alcohol and ethyl chloride was sprayed at the injection site.  A 21-gauge needle was used to inject Orthovisc hyaluronic acid into the right knee using an anterolateral approach.  There were no complications. A sterile bandage was applied.      Follow-up: Return in about 1 week (around 08/26/2023).  Subjective:  Chief Complaint  Patient presents with   Injections    Orthovisc 1 right knee    History of Present Illness: Jacob Powell is a 53 y.o. male who returns to  clinic for evaluation of right knee pain.  His knee continues to bother him.  He has been using a brace.  Prior injections were helpful, but the improvements were limited.  He is interested in hyaluronic acid injections.  Authorization has been completed.   Review of Systems: No fevers or chills No numbness or tingling No chest pain No shortness of breath No bowel or bladder dysfunction No GI distress No headaches    Objective: There were no vitals taken for this visit.  Physical Exam:  General: Alert and oriented. and No acute distress. Gait: Right sided antalgic gait.  Right knee with a mild effusion.  Varus alignment overall.  He has tenderness to palpation along the medial joint line.  No increased laxity to varus or valgus stress.  Pain with hyperflexion.  There is some swelling within the popliteal space.  Negative Lachman.  IMAGING: I personally reviewed images previously obtained in clinic  No new imaging today   New Medications:  Meds ordered this encounter  Medications   Hyaluronan (ORTHOVISC) intra-articular injection 30 mg      Tonita Frater, MD  08/19/2023 2:02 PM

## 2023-08-19 NOTE — Patient Instructions (Signed)
Instructions Following Joint Injections  In clinic today, you received an injection in one of your joints (sometimes more than one).  Occasionally, you can have some pain at the injection site, this is normal.  You can place ice at the injection site, or take over-the-counter medications such as Tylenol (acetaminophen) or Advil (ibuprofen).  Please follow all directions listed on the bottle.  If your joint (knee or shoulder) becomes swollen, red or very painful, please contact the clinic for additional assistance.   Injections in the same joint cannot be repeated for 3 months.  This helps to limit the risk of an infection in the joint.  If you were to develop an infection in your joint, the best treatment option would be surgery.

## 2023-08-26 ENCOUNTER — Ambulatory Visit (HOSPITAL_COMMUNITY)
Admission: RE | Admit: 2023-08-26 | Discharge: 2023-08-26 | Disposition: A | Discharge status: Home / Self Care | Source: Ambulatory Visit | Attending: Internal Medicine | Admitting: Internal Medicine

## 2023-08-26 ENCOUNTER — Ambulatory Visit: Admitting: Orthopedic Surgery

## 2023-08-26 ENCOUNTER — Encounter (HOSPITAL_BASED_OUTPATIENT_CLINIC_OR_DEPARTMENT_OTHER)
Admission: RE | Admit: 2023-08-26 | Discharge: 2023-08-26 | Disposition: A | Discharge status: Home / Self Care | Source: Ambulatory Visit | Attending: Internal Medicine | Admitting: Internal Medicine

## 2023-08-26 DIAGNOSIS — M1711 Unilateral primary osteoarthritis, right knee: Secondary | ICD-10-CM | POA: Diagnosis not present

## 2023-08-26 DIAGNOSIS — R079 Chest pain, unspecified: Secondary | ICD-10-CM | POA: Diagnosis present

## 2023-08-26 LAB — NM MYOCAR MULTI W/SPECT W/WALL MOTION / EF
Estimated workload: 1
Exercise duration (min): 0 min
Exercise duration (sec): 0 s
LV dias vol: 129 mL (ref 62–150)
LV sys vol: 54 mL
MPHR: 166 {beats}/min
Nuc Stress EF: 58 %
Peak HR: 76 {beats}/min
Percent HR: 45 %
RATE: 0.3
Rest HR: 46 {beats}/min
Rest Nuclear Isotope Dose: 11 mCi
SDS: 2
SRS: 0
SSS: 2
ST Depression (mm): 0 mm
Stress Nuclear Isotope Dose: 33 mCi
TID: 1.24

## 2023-08-26 LAB — ECHOCARDIOGRAM COMPLETE
AR max vel: 2.17 cm2
AV Area VTI: 2.67 cm2
AV Area mean vel: 2.27 cm2
AV Mean grad: 3.7 mmHg
AV Peak grad: 7.4 mmHg
Ao pk vel: 1.36 m/s
Area-P 1/2: 3.48 cm2
P 1/2 time: 756 ms
S' Lateral: 3.1 cm

## 2023-08-26 MED ORDER — TECHNETIUM TC 99M TETROFOSMIN IV KIT
10.0000 | PACK | Freq: Once | INTRAVENOUS | Status: AC | PRN
  Administered 2023-08-26: 11 via INTRAVENOUS

## 2023-08-26 MED ORDER — SODIUM CHLORIDE FLUSH 0.9 % IV SOLN
INTRAVENOUS | Status: AC
  Administered 2023-08-26: 10 mL via INTRAVENOUS
  Filled 2023-08-26: qty 10

## 2023-08-26 MED ORDER — HYALURONAN 30 MG/2ML IX SOSY
30.0000 mg | PREFILLED_SYRINGE | Freq: Once | INTRA_ARTICULAR | Status: AC
  Administered 2023-08-26: 30 mg via INTRA_ARTICULAR

## 2023-08-26 MED ORDER — REGADENOSON 0.4 MG/5ML IV SOLN
INTRAVENOUS | Status: AC
Start: 2023-08-26 — End: 2023-08-26
  Administered 2023-08-26: 0.4 mg via INTRAVENOUS
  Filled 2023-08-26: qty 5

## 2023-08-26 MED ORDER — TECHNETIUM TC 99M TETROFOSMIN IV KIT
30.0000 | PACK | Freq: Once | INTRAVENOUS | Status: AC | PRN
  Administered 2023-08-26: 33 via INTRAVENOUS

## 2023-08-26 NOTE — Patient Instructions (Signed)
Instructions Following Joint Injections  In clinic today, you received an injection in one of your joints (sometimes more than one).  Occasionally, you can have some pain at the injection site, this is normal.  You can place ice at the injection site, or take over-the-counter medications such as Tylenol (acetaminophen) or Advil (ibuprofen).  Please follow all directions listed on the bottle.  If your joint (knee or shoulder) becomes swollen, red or very painful, please contact the clinic for additional assistance.   Injections in the same joint cannot be repeated for 3 months.  This helps to limit the risk of an infection in the joint.  If you were to develop an infection in your joint, the best treatment option would be surgery.

## 2023-08-26 NOTE — Progress Notes (Signed)
 Return patient Visit  Assessment: Jacob Powell is a 53 y.o. male with the following: Right knee pain; moderate to severe arthritis with degenerative tearing of the medial meniscus  Plan: DRAYK PASSEY continues to have pain in the right knee.  He has degenerative changes in the right knee.  He has done well following the first injection.  He is here for the second in a series of 3.  Injection completed without issues.  He will follow-up in 1 week.   This patient is diagnosed with osteoarthritis of the knee(s).    Radiographs show evidence of joint space narrowing, osteophytes, subchondral sclerosis and/or subchondral cysts.  This patient has knee pain which interferes with functional and activities of daily living.    This patient has experienced inadequate response, adverse effects and/or intolerance with conservative treatments such as acetaminophen , NSAIDS, topical creams, physical therapy or regular exercise, knee bracing and/or weight loss.   This patient has experienced inadequate response or has a contraindication to intra articular steroid injections for at least 3 months.   This patient is not scheduled to have a total knee replacement within 6 months of starting treatment with viscosupplementation.   Procedure note injection Right knee joint   Verbal consent was obtained to inject the right knee joint  Timeout was completed to confirm the site of injection.  The skin was prepped with alcohol and ethyl chloride was sprayed at the injection site.  A 21-gauge needle was used to inject Orthovisc hyaluronic acid into the right knee using an anterolateral approach.  There were no complications. A sterile bandage was applied.      Follow-up: Return in about 1 week (around 09/02/2023).  Subjective:  Chief Complaint  Patient presents with   Injections    Right knee orthovisc 2     History of Present Illness: Jacob Powell is a 53 y.o. male who returns to clinic for  evaluation of right knee pain.  He is doing well.  First injection of hyaluronic acid was tolerated well.  He states that his knee feels better.  He is ready to proceed with his second injection.  Review of Systems: No fevers or chills No numbness or tingling No chest pain No shortness of breath No bowel or bladder dysfunction No GI distress No headaches    Objective: There were no vitals taken for this visit.  Physical Exam:  General: Alert and oriented. and No acute distress. Gait: Right sided antalgic gait.  Right knee with a mild effusion.  Varus alignment overall.  He has tenderness to palpation along the medial joint line.  No increased laxity to varus or valgus stress.  Pain with hyperflexion.  There is some swelling within the popliteal space.  Negative Lachman.  IMAGING: I personally reviewed images previously obtained in clinic  No new imaging today   New Medications:  Meds ordered this encounter  Medications   Hyaluronan (ORTHOVISC) intra-articular injection 30 mg      Neoma Uhrich A Russie Gulledge, MD  08/26/2023 4:50 PM

## 2023-08-26 NOTE — Progress Notes (Signed)
*  PRELIMINARY RESULTS* Echocardiogram 2D Echocardiogram has been performed.  Bernis Brisker 08/26/2023, 9:31 AM

## 2023-08-31 ENCOUNTER — Other Ambulatory Visit: Payer: Self-pay

## 2023-08-31 MED ORDER — ISOSORBIDE MONONITRATE ER 30 MG PO TB24: 30.0000 mg | ORAL_TABLET | Freq: Every day | ORAL | 3 refills | Status: DC

## 2023-09-02 ENCOUNTER — Ambulatory Visit (INDEPENDENT_AMBULATORY_CARE_PROVIDER_SITE_OTHER): Admitting: Orthopedic Surgery

## 2023-09-02 ENCOUNTER — Encounter: Payer: Self-pay | Admitting: Orthopedic Surgery

## 2023-09-02 DIAGNOSIS — M1711 Unilateral primary osteoarthritis, right knee: Secondary | ICD-10-CM

## 2023-09-02 NOTE — Progress Notes (Signed)
 Return patient Visit  Assessment: Jacob Powell is a 53 y.o. 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  He has degenerative changes in the right knee.  He noticed some irritation of his knee following the most recent injection.  The final injection was completed in clinic today.  He had no issues.  He will follow-up as needed.   This patient is diagnosed with osteoarthritis of the knee(s).    Radiographs show evidence of joint space narrowing, osteophytes, subchondral sclerosis and/or subchondral cysts.  This patient has knee pain which interferes with functional and activities of daily living.    This patient has experienced inadequate response, adverse effects and/or intolerance with conservative treatments such as acetaminophen , NSAIDS, topical creams, physical therapy or regular exercise, knee bracing and/or weight loss.   This patient has experienced inadequate response or has a contraindication to intra articular steroid injections for at least 3 months.   This patient is not scheduled to have a total knee replacement within 6 months of starting treatment with viscosupplementation.   Procedure note injection Right knee joint   Verbal consent was obtained to inject the right knee joint  Timeout was completed to confirm the site of injection.  The skin was prepped with alcohol and ethyl chloride was sprayed at the injection site.  A 21-gauge needle was used to inject Orthovisc hyaluronic acid into the right knee using an anterolateral approach.  There were no complications. A sterile bandage was applied.      Follow-up: Return if symptoms worsen or fail to improve.  Subjective:  Chief Complaint  Patient presents with   Injections    Ortho Visc #3 R knee  NRC: [id number] ZHY:[id number] Exp: 04/21/25    History of Present  Illness: Jacob Powell is a 52 y.o. male who returns to clinic for evaluation of right knee pain.  He is doing well.  Following the second injection last week, he notes more irritation in the right knee.  Pain is primarily medial.  He is here to complete his series of 3 injections.    Review of Systems: No fevers or chills No numbness or tingling No chest pain No shortness of breath No bowel or bladder dysfunction No GI distress No headaches    Objective: There were no vitals taken for this visit.  Physical Exam:  General: Alert and oriented. and No acute distress. Gait: Right sided antalgic gait.  Right knee with a mild effusion.  Varus alignment overall.  He has tenderness to palpation along the medial joint line.  No increased laxity to varus or valgus stress.  Pain with hyperflexion.  There is some swelling within the popliteal space.  Negative Lachman.  Injection site is not visible.  There is no redness.  No irritation.  No fluctuance.  IMAGING: I personally reviewed images previously obtained in clinic  No new imaging today   New Medications:  No orders of the defined types were placed in this encounter.     Tonita Frater, MD  09/02/2023 11:54 AM

## 2023-09-02 NOTE — Patient Instructions (Signed)
Instructions Following Joint Injections  In clinic today, you received an injection in one of your joints (sometimes more than one).  Occasionally, you can have some pain at the injection site, this is normal.  You can place ice at the injection site, or take over-the-counter medications such as Tylenol (acetaminophen) or Advil (ibuprofen).  Please follow all directions listed on the bottle.  If your joint (knee or shoulder) becomes swollen, red or very painful, please contact the clinic for additional assistance.   Injections in the same joint cannot be repeated for 3 months.  This helps to limit the risk of an infection in the joint.  If you were to develop an infection in your joint, the best treatment option would be surgery.

## 2023-09-24 ENCOUNTER — Encounter: Payer: Self-pay | Admitting: Internal Medicine

## 2023-09-28 ENCOUNTER — Other Ambulatory Visit: Payer: Self-pay

## 2023-09-28 MED ORDER — RANOLAZINE ER 500 MG PO TB12: 500.0000 mg | ORAL_TABLET | Freq: Two times a day (BID) | ORAL | 3 refills | Status: AC

## 2023-10-12 ENCOUNTER — Ambulatory Visit: Admitting: Internal Medicine

## 2023-10-15 ENCOUNTER — Encounter: Payer: Self-pay | Admitting: Internal Medicine

## 2023-10-15 ENCOUNTER — Ambulatory Visit: Attending: Internal Medicine | Admitting: Internal Medicine

## 2023-10-15 VITALS — BP 118/78 | HR 58 | Ht 70.0 in | Wt 265.2 lb

## 2023-10-15 DIAGNOSIS — E7849 Other hyperlipidemia: Secondary | ICD-10-CM

## 2023-10-15 DIAGNOSIS — E785 Hyperlipidemia, unspecified: Secondary | ICD-10-CM | POA: Insufficient documentation

## 2023-10-15 NOTE — Progress Notes (Signed)
 Cardiology Office Note  Date: 10/15/2023   ID: ANDRIUS ANDREPONT, DOB 04/30/1970, MRN [id number]  PCP:  Omie Bickers, MD  Cardiologist:  Lasalle Pointer, MD Electrophysiologist:  None   History of Present Illness: Jacob Powell is a 53 y.o. male known to have HTN is here for follow-up visit of chest pain.  Initially referred to cardiology clinic for evaluation of sporadic episodes of exertional chest tightness that lasted for few minutes and resolved with rest.  It occurred couple of times in an year. Coronary calcium score is 199 in 2024 (91st percentile for age and sex matched control, 196 in LAD and 3.60 in RCA).  Echocardiogram in 2025 showed normal LVEF, normal RV function, normal diastology, no valvular heart disease except for trivial AI.  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  Overall low risk study.  He was initially started on Imdur  that was later switched to ranolazine  500 mg daily due to severe headaches with Imdur .  He is here for follow-up visit.  No interval angina.  Does not have any other symptoms of DOE, dizziness, syncope, palpitations or leg swelling.  Does not smoke cigarettes.  Past Medical History:  Diagnosis Date   History of kidney stones    Hypertension 12/28/2006    Past Surgical History:  Procedure Laterality Date   APPENDECTOMY     CHOLECYSTECTOMY N/A 01/24/2018   Procedure: LAPAROSCOPIC CHOLECYSTECTOMY WITH POSSIBLE INTRAOPERATIVE CHOLANGIOGRAM;  Surgeon: Sim Dryer, MD;  Location: [facility] OR;  Service: General;  Laterality: N/A;   COLONOSCOPY WITH PROPOFOL  N/A 07/20/2020   Procedure: COLONOSCOPY WITH PROPOFOL ;  Surgeon: Vinetta Greening, DO;  [facility];  Service: Endoscopy;  Laterality: N/A;  ASA II/ AM procedure   FOOT TENDON SURGERY Left    FRACTURE SURGERY Right    finger on right hand   HERNIA REPAIR     POLYPECTOMY  07/20/2020   Procedure: POLYPECTOMY;  Surgeon:  Vinetta Greening, DO;  [facility];  Service: Endoscopy;;    Current Outpatient Medications  Medication Sig Dispense Refill   acetaminophen  (TYLENOL ) 500 MG tablet Take 1,000 mg by mouth as needed for headache.     amLODipine  (NORVASC ) 5 MG tablet Take 5 mg by mouth daily.     aspirin  EC 81 MG tablet Take 1 tablet (81 mg total) by mouth daily. Swallow whole.     levocetirizine (XYZAL) 5 MG tablet Take 5 mg by mouth daily.     Melatonin 5 MG CAPS Take 10-15 mg by mouth at bedtime.     Multiple Vitamins-Minerals (MENS MULTIVITAMIN PLUS PO) Take 1 tablet by mouth daily.     nebivolol (BYSTOLIC) 10 MG tablet Take 10 mg by mouth at bedtime.     nitroGLYCERIN  (NITROSTAT ) 0.4 MG SL tablet Place 1 tablet (0.4 mg total) under the tongue every 5 (five) minutes as needed for chest pain. If a single episode of chest pain is not relieved by one tablet, the patient will try another within 5 minutes; and if this doesn't relieve the pain, the patient is instructed to call 911 for transportation to an emergency department. 25 tablet 3   olmesartan-hydrochlorothiazide  (BENICAR HCT) 40-25 MG tablet Take 1 tablet by mouth daily.     pantoprazole (PROTONIX) 40 MG tablet Take 40 mg by mouth daily.     ranolazine  (RANEXA ) 500 MG 12 hr tablet Take 1 tablet (500 mg total) by mouth 2 (two)  times daily. 180 tablet 3   rosuvastatin (CRESTOR) 10 MG tablet Take 10 mg by mouth daily.     traMADol  (ULTRAM ) 50 MG tablet Take 1 tablet (50 mg total) by mouth every 6 (six) hours as needed. 20 tablet 0   No current facility-administered medications for this visit.   Allergies:  Patient has no known allergies.   Social History: The patient  reports that he has never smoked. He has never used smokeless tobacco. He reports current alcohol use. He reports that he does not use drugs.   Family History: The patient's family history includes Cancer in his father and mother.   ROS:  Please see the history of present  illness. Otherwise, complete review of systems is positive for none.  All other systems are reviewed and negative.   Physical Exam: VS:  BP 118/78 (BP Location: Left Arm, Patient Position: Sitting, Cuff Size: Large)   Pulse (!) 58   Ht 5' 10 (1.778 m)   Wt 265 lb 3.2 oz (120.3 kg)   SpO2 96%   BMI 38.05 kg/m , BMI Body mass index is 38.05 kg/m.  Wt Readings from Last 3 Encounters:  10/15/23 265 lb 3.2 oz (120.3 kg)  07/29/23 263 lb (119.3 kg)  05/27/23 232 lb (105.2 kg)    General: Patient appears comfortable at rest. HEENT: Conjunctiva and lids normal, oropharynx clear with moist mucosa. Neck: Supple, no elevated JVP or carotid bruits, no thyromegaly. Lungs: Clear to auscultation, nonlabored breathing at rest. Cardiac: Regular rate and rhythm, no S3 or significant systolic murmur, no pericardial rub. Abdomen: Soft, nontender, no hepatomegaly, bowel sounds present, no guarding or rebound. Extremities: No pitting edema, distal pulses 2+. Skin: Warm and dry. Musculoskeletal: No kyphosis. Neuropsychiatric: Alert and oriented x3, affect grossly appropriate.  Recent Labwork: No results found for requested labs within last 365 days.  No results found for: CHOL, TRIG, HDL, CHOLHDL, VLDL, LDLCALC, LDLDIRECT   Assessment and Plan:   Cardiac chest pain: Sporadic episodes of substernal exertional chest tightness that lasted for minutes and resolved with rest. Coronary calcium score was 199 (LAD 196 and RCA 3.6).  Echocardiogram showed normal LVEF, normal diastology, normal RV function, no valvular heart disease except for trivial AI. Lexiscan  findings consistent with prior inferior MI with mild peri-infarct ischemia, overall low risk study.  No LAD ischemia.  He did not tolerate Imdur , switched to ranolazine  500 mg twice daily.  He did not have any episodes of angina.  If he develops refractory angina despite being on maximal antianginal therapy in the future, he will benefit  from [facility]. Continue aspirin  81 mg once daily, rosuvastatin 10 mg at bedtime. SL NTG as needed for chest pain.  ER precautions for chest pain provided.  Elevated coronary calcium score 199 (91st percentile for age and sex matched control): LAD 196, RCA 3.60.  Same plan as above.  HTN, controlled: Continue current antihypertensives, amlodipine  5 mg once daily, Nebivolol 10 mg once daily and olmesartan-HCTZ 40-25 mg once daily.  Blood pressures at home range around 110 to 120 mmHg SBP.  Previously, his blood pressures were poorly controlled.  HLD, at goal: Continue rosuvastatin 10 mg nightly.  Goal LDL is 70.  LDL 69 in 2025.   Medication Adjustments/Labs and Tests Ordered: Current medicines are reviewed at length with the patient today.  Concerns regarding medicines are outlined above.    Disposition:  Follow up 8 months  Signed, Maren Wiesen Beauford Bounds, MD, 10/15/2023 12:39 PM  [facility] at [facility] [address], [place], [place] [address]

## 2023-10-15 NOTE — Patient Instructions (Addendum)
Medication Instructions:  Your physician recommends that you continue on your current medications as directed. Please refer to the Current Medication list given to you today.  Labwork: none  Testing/Procedures: none  Follow-Up: Your physician recommends that you schedule a follow-up appointment in: 8 months  Any Other Special Instructions Will Be Listed Below (If Applicable).  If you need a refill on your cardiac medications before your next appointment, please call your pharmacy.

## 2024-01-27 DIAGNOSIS — E782 Mixed hyperlipidemia: Secondary | ICD-10-CM | POA: Diagnosis not present

## 2024-01-27 DIAGNOSIS — Z125 Encounter for screening for malignant neoplasm of prostate: Secondary | ICD-10-CM | POA: Diagnosis not present

## 2024-01-27 DIAGNOSIS — R7301 Impaired fasting glucose: Secondary | ICD-10-CM | POA: Diagnosis not present

## 2024-02-09 DIAGNOSIS — Z23 Encounter for immunization: Secondary | ICD-10-CM | POA: Diagnosis not present

## 2024-02-15 ENCOUNTER — Other Ambulatory Visit (HOSPITAL_COMMUNITY): Payer: Self-pay | Admitting: Nurse Practitioner

## 2024-02-15 DIAGNOSIS — R109 Unspecified abdominal pain: Secondary | ICD-10-CM

## 2024-02-15 DIAGNOSIS — R1011 Right upper quadrant pain: Secondary | ICD-10-CM | POA: Diagnosis not present

## 2024-02-15 DIAGNOSIS — R1012 Left upper quadrant pain: Secondary | ICD-10-CM | POA: Diagnosis not present

## 2024-02-16 ENCOUNTER — Ambulatory Visit (HOSPITAL_COMMUNITY)
Admission: RE | Admit: 2024-02-16 | Discharge: 2024-02-16 | Disposition: A | Discharge status: Home / Self Care | Source: Ambulatory Visit | Attending: Nurse Practitioner | Admitting: Nurse Practitioner

## 2024-02-16 DIAGNOSIS — R109 Unspecified abdominal pain: Secondary | ICD-10-CM | POA: Diagnosis not present

## 2024-02-16 DIAGNOSIS — K5730 Diverticulosis of large intestine without perforation or abscess without bleeding: Secondary | ICD-10-CM | POA: Diagnosis not present

## 2024-02-16 DIAGNOSIS — N200 Calculus of kidney: Secondary | ICD-10-CM | POA: Diagnosis not present

## 2024-02-16 DIAGNOSIS — K4090 Unilateral inguinal hernia, without obstruction or gangrene, not specified as recurrent: Secondary | ICD-10-CM | POA: Diagnosis not present

## 2024-02-16 MED ORDER — IOHEXOL 300 MG/ML  SOLN
100.0000 mL | Freq: Once | INTRAMUSCULAR | Status: AC | PRN
  Administered 2024-02-16: 100 mL via INTRAVENOUS

## 2024-02-16 MED ORDER — IOHEXOL 9 MG/ML PO SOLN
500.0000 mL | ORAL | Status: AC
  Administered 2024-02-16: 500 mL via ORAL

## 2024-02-16 MED ORDER — IOHEXOL 9 MG/ML PO SOLN
ORAL | Status: AC
  Filled 2024-02-16: qty 1000

## 2024-02-24 DIAGNOSIS — E782 Mixed hyperlipidemia: Secondary | ICD-10-CM | POA: Diagnosis not present

## 2024-02-24 DIAGNOSIS — N401 Enlarged prostate with lower urinary tract symptoms: Secondary | ICD-10-CM | POA: Diagnosis not present

## 2024-02-24 DIAGNOSIS — Z0001 Encounter for general adult medical examination with abnormal findings: Secondary | ICD-10-CM | POA: Diagnosis not present

## 2024-02-24 DIAGNOSIS — R350 Frequency of micturition: Secondary | ICD-10-CM | POA: Diagnosis not present

## 2024-02-24 DIAGNOSIS — N529 Male erectile dysfunction, unspecified: Secondary | ICD-10-CM | POA: Diagnosis not present

## 2024-02-24 DIAGNOSIS — M1711 Unilateral primary osteoarthritis, right knee: Secondary | ICD-10-CM | POA: Diagnosis not present

## 2024-02-24 DIAGNOSIS — I10 Essential (primary) hypertension: Secondary | ICD-10-CM | POA: Diagnosis not present

## 2024-02-24 DIAGNOSIS — G4733 Obstructive sleep apnea (adult) (pediatric): Secondary | ICD-10-CM | POA: Diagnosis not present

## 2024-02-25 DIAGNOSIS — R350 Frequency of micturition: Secondary | ICD-10-CM | POA: Diagnosis not present

## 2024-02-25 DIAGNOSIS — N401 Enlarged prostate with lower urinary tract symptoms: Secondary | ICD-10-CM | POA: Diagnosis not present

## 2024-02-29 ENCOUNTER — Encounter: Payer: Self-pay | Admitting: Radiology

## 2024-03-01 ENCOUNTER — Encounter: Payer: Self-pay | Admitting: Internal Medicine
